# Patient Record
Sex: Female | Born: 1960 | Race: Black or African American | Hispanic: No | Marital: Single | Smoking: Never smoker
Health system: Southern US, Community
[De-identification: ages and names within clinical notes are randomized; demographics above are authoritative.]

## PROBLEM LIST (undated history)

## (undated) DIAGNOSIS — E119 Type 2 diabetes mellitus without complications: Secondary | ICD-10-CM

## (undated) DIAGNOSIS — I10 Essential (primary) hypertension: Secondary | ICD-10-CM

## (undated) HISTORY — PX: CHOLECYSTECTOMY: SHX55

## (undated) HISTORY — PX: ANKLE FRACTURE SURGERY: SHX122

## (undated) HISTORY — PX: ABDOMINAL HYSTERECTOMY: SHX81

## (undated) HISTORY — PX: THROAT SURGERY: SHX803

---

## 2006-08-27 DIAGNOSIS — F419 Anxiety disorder, unspecified: Secondary | ICD-10-CM | POA: Insufficient documentation

## 2006-09-12 DIAGNOSIS — M109 Gout, unspecified: Secondary | ICD-10-CM | POA: Insufficient documentation

## 2006-10-14 DIAGNOSIS — Z9089 Acquired absence of other organs: Secondary | ICD-10-CM | POA: Insufficient documentation

## 2006-10-14 DIAGNOSIS — M159 Polyosteoarthritis, unspecified: Secondary | ICD-10-CM | POA: Insufficient documentation

## 2006-10-14 DIAGNOSIS — Z9079 Acquired absence of other genital organ(s): Secondary | ICD-10-CM | POA: Insufficient documentation

## 2006-10-14 DIAGNOSIS — D369 Benign neoplasm, unspecified site: Secondary | ICD-10-CM | POA: Insufficient documentation

## 2010-09-05 DIAGNOSIS — E785 Hyperlipidemia, unspecified: Secondary | ICD-10-CM | POA: Insufficient documentation

## 2010-09-14 DIAGNOSIS — R0683 Snoring: Secondary | ICD-10-CM | POA: Insufficient documentation

## 2010-09-14 DIAGNOSIS — E669 Obesity, unspecified: Secondary | ICD-10-CM | POA: Insufficient documentation

## 2010-09-14 DIAGNOSIS — R0681 Apnea, not elsewhere classified: Secondary | ICD-10-CM | POA: Insufficient documentation

## 2016-03-26 ENCOUNTER — Emergency Department (HOSPITAL_COMMUNITY): Payer: Self-pay

## 2016-03-26 ENCOUNTER — Emergency Department (HOSPITAL_COMMUNITY)
Admission: EM | Admit: 2016-03-26 | Discharge: 2016-03-26 | Disposition: A | Discharge status: Home / Self Care | Payer: Self-pay | Attending: Emergency Medicine | Admitting: Emergency Medicine

## 2016-03-26 ENCOUNTER — Encounter (HOSPITAL_COMMUNITY): Payer: Self-pay

## 2016-03-26 DIAGNOSIS — E119 Type 2 diabetes mellitus without complications: Secondary | ICD-10-CM | POA: Insufficient documentation

## 2016-03-26 DIAGNOSIS — I10 Essential (primary) hypertension: Secondary | ICD-10-CM | POA: Insufficient documentation

## 2016-03-26 DIAGNOSIS — M79641 Pain in right hand: Secondary | ICD-10-CM | POA: Insufficient documentation

## 2016-03-26 DIAGNOSIS — M25511 Pain in right shoulder: Secondary | ICD-10-CM | POA: Insufficient documentation

## 2016-03-26 HISTORY — DX: Essential (primary) hypertension: I10

## 2016-03-26 HISTORY — DX: Type 2 diabetes mellitus without complications: E11.9

## 2016-03-26 MED ORDER — CYCLOBENZAPRINE HCL 10 MG PO TABS
5.0000 mg | ORAL_TABLET | Freq: Once | ORAL | Status: AC
  Administered 2016-03-26: 5 mg via ORAL
  Filled 2016-03-26: qty 1

## 2016-03-26 MED ORDER — ACETAMINOPHEN 325 MG PO TABS
650.0000 mg | ORAL_TABLET | Freq: Once | ORAL | Status: AC
  Administered 2016-03-26: 650 mg via ORAL
  Filled 2016-03-26: qty 2

## 2016-03-26 MED ORDER — CYCLOBENZAPRINE HCL 10 MG PO TABS: 10.0000 mg | ORAL_TABLET | Freq: Two times a day (BID) | ORAL | 0 refills | Status: DC | PRN

## 2016-03-26 NOTE — ED Provider Notes (Signed)
[facility]EM[facility]ENCY DEPT Provider Note   CSN: [id number] Arrival date & time: 03/26/16  1127     History   Chief Complaint Chief Complaint  Patient presents with  . Hand Pain    HPI Brenda Nixon is a 55 y.o. female who presents to the ED with right hand pain. Patient reports that over a month ago she had right shoulder pain and her PCP treated her for arthritis with medication. Patient reports that now the pain extends to her right hand and for past week has been bad enough to wake her up at night. At work she does repetitive motion with her hands. She has been taking tylenol arthritis without relief.  The history is provided by the patient. No language interpreter was used.    Past Medical History:  Diagnosis Date  . Diabetes mellitus without complication (HCC)   . Hypertension     There are no active problems to display for this patient.   Past Surgical History:  Procedure Laterality Date  . ABDOMINAL HYSTERECTOMY    . ANKLE FRACTURE SURGERY    . CHOLECYSTECTOMY    . THROAT SURGERY      OB History    No data available       Home Medications    Prior to Admission medications   Medication Sig Start Date End Date Taking? Authorizing Provider  cyclobenzaprine (FLEXERIL) 10 MG tablet Take 1 tablet (10 mg total) by mouth 2 (two) times daily as needed for muscle spasms. 03/26/16   Kenasia Scheller Orlene OchM Avaiyah Strubel, NP    Family History No family history on file.  Social History Social History  Substance Use Topics  . Smoking status: Never Smoker  . Smokeless tobacco: Never Used  . Alcohol use No     Allergies   Sulfa antibiotics   Review of Systems Review of Systems  Constitutional: Negative for fever.  HENT: Negative.   Eyes: Positive for itching. Negative for pain, redness and visual disturbance.  Respiratory: Negative for shortness of breath.   Cardiovascular: Negative for chest pain.  Gastrointestinal: Negative for abdominal distention and vomiting.  Musculoskeletal:  Positive for arthralgias.       Right shoulder and radiates down arm to hand.  Skin: Negative for rash and wound.  Neurological: Negative for dizziness and headaches.  Psychiatric/Behavioral: Negative for confusion. The patient is not nervous/anxious.      Physical Exam Updated Vital Signs BP 163/88 (BP Location: Left Arm)   Pulse 83   Temp 98.3 F (36.8 C) (Oral)   Resp 17   Ht 5\' 1"  (1.549 m)   Wt 70.8 kg   SpO2 100%   BMI 29.48 kg/m   Physical Exam  Constitutional: She is oriented to person, place, and time. She appears well-developed and well-nourished.  HENT:  Head: Normocephalic.  Eyes: EOM are normal.  Neck: Normal range of motion. Neck supple. Muscular tenderness present. No spinous process tenderness present.  Pulmonary/Chest: Effort normal.  Musculoskeletal:       Right shoulder: She exhibits tenderness, pain and spasm. She exhibits no crepitus, no deformity, no laceration, normal pulse and normal strength. Decreased range of motion: due to pain.  Pain to the anterior aspect of the right shoulder that radiates to the right hand. Radial pulses 2+, adequate circulation. Equal grips.   Neurological: She is alert and oriented to person, place, and time.  Skin: Skin is warm and dry.  Psychiatric: She has a normal mood and affect. Her behavior is normal.  Nursing  note and vitals reviewed.    ED Treatments / Results  Labs (all labs ordered are listed, but only abnormal results are displayed) Labs Reviewed - No data to display  Radiology Dg Shoulder Right  Result Date: 03/26/2016 CLINICAL DATA:  Right shoulder pain, chronic with recent worsening. No reported injury. EXAM: RIGHT SHOULDER - 2+ VIEW COMPARISON:  None. FINDINGS: No fracture, dislocation or suspicious focal osseous lesion. Moderate erosive arthropathy is seen at the right acromioclavicular joint. Right glenohumeral joint appears intact. No pathologic soft tissue calcifications. IMPRESSION: Moderate erosive  arthropathy at the right acromioclavicular joint, which could be due to rheumatoid arthritis, primary hyperparathyroidism or repetitive stress such as from weightlifting. Electronically Signed   By: Delbert Phenix M.D.   On: 03/26/2016 13:25   Dg Hand Complete Right  Result Date: 03/26/2016 CLINICAL DATA:  Pain extending from the shoulder to the anterior third metacarpal. EXAM: RIGHT HAND - COMPLETE 3+ VIEW COMPARISON:  None. FINDINGS: There is no evidence of fracture or dislocation. There is no evidence of arthropathy or other focal bone abnormality. Soft tissues are unremarkable. IMPRESSION: Negative. Electronically Signed   By: Gaylyn Rong M.D.   On: 03/26/2016 12:19    Procedures Procedures (including critical care time)  Medications Ordered in ED Medications  cyclobenzaprine (FLEXERIL) tablet 5 mg (5 mg Oral Given 03/26/16 1347)  acetaminophen (TYLENOL) tablet 650 mg (650 mg Oral Given 03/26/16 1347)     Initial Impression / Assessment and Plan / ED Course  I have reviewed the triage vital signs and the nursing notes.  Pertinent imaging results that were available during my care of the patient were reviewed by me and considered in my medical decision making (see chart for details).  Clinical Course    Final Clinical Impressions(s) / ED Diagnoses  55 y.o. female with right shoulder pain that radiates to the right arm and to the hand stable for d/c with improved symptoms after tylenol and flexeril, no focal neuro deficits. Discussed with the patient and all questioned fully answered. She will return  if any problems arise.   Final diagnoses:  Shoulder pain, acute, right  Hand pain, right    New Prescriptions Discharge Medication List as of 03/26/2016  2:30 PM    START taking these medications   Details  cyclobenzaprine (FLEXERIL) 10 MG tablet Take 1 tablet (10 mg total) by mouth 2 (two) times daily as needed for muscle spasms., Starting Mon 03/26/2016, Print           [place], NP 03/27/16 2254    Donnetta Hutching, MD 03/28/16 [phone]

## 2016-03-26 NOTE — ED Notes (Signed)
Patient currently in xray ?

## 2016-03-26 NOTE — ED Triage Notes (Signed)
Per pt, Pt is coming from home with right hand pain. Pt has a Hx of right arm pain for four months that has been being treated by PCP with medication. About a month ago, pt reports hand starting hurt in the center of her hand with no relief with TYlenol.

## 2017-04-01 ENCOUNTER — Encounter: Payer: Self-pay | Admitting: Endocrinology

## 2017-04-22 LAB — HM DIABETES EYE EXAM

## 2017-04-30 ENCOUNTER — Ambulatory Visit (INDEPENDENT_AMBULATORY_CARE_PROVIDER_SITE_OTHER): Payer: 59 | Admitting: Endocrinology

## 2017-04-30 ENCOUNTER — Ambulatory Visit: Payer: Self-pay | Admitting: Endocrinology

## 2017-04-30 ENCOUNTER — Encounter: Payer: Self-pay | Admitting: Endocrinology

## 2017-04-30 DIAGNOSIS — Z794 Long term (current) use of insulin: Secondary | ICD-10-CM

## 2017-04-30 DIAGNOSIS — E049 Nontoxic goiter, unspecified: Secondary | ICD-10-CM

## 2017-04-30 DIAGNOSIS — E119 Type 2 diabetes mellitus without complications: Secondary | ICD-10-CM | POA: Diagnosis not present

## 2017-04-30 DIAGNOSIS — I10 Essential (primary) hypertension: Secondary | ICD-10-CM | POA: Diagnosis not present

## 2017-04-30 MED ORDER — METFORMIN HCL ER 500 MG PO TB24: 2000.0000 mg | ORAL_TABLET | Freq: Every day | ORAL | 3 refills | Status: DC

## 2017-04-30 MED ORDER — DULAGLUTIDE 0.75 MG/0.5ML ~~LOC~~ SOAJ: 0.7500 mg | SUBCUTANEOUS | 11 refills | Status: DC

## 2017-04-30 MED ORDER — INSULIN DEGLUDEC 100 UNIT/ML ~~LOC~~ SOPN: 10.0000 [IU] | PEN_INJECTOR | Freq: Every day | SUBCUTANEOUS | 11 refills | Status: DC

## 2017-04-30 NOTE — Progress Notes (Signed)
Subjective:    Patient ID: Brenda Nixon, female    DOB: 1960-12-06, 56 y.o.   MRN: [id number]  HPI pt is referred by Salley Slaughter, NP, for diabetes.  Pt states DM was dx'ed in 1994 (she had GDM in 1990); she has mild if any neuropathy of the lower extremities; she is unaware of any associated chronic complications; she has been on insulin since 2015; pt says her diet and exercise are fair; she has never had GDM, pancreatitis, pancreatic surgery, severe hypoglycemia or DKA.  Pt says she was told at age 11, she had a goiter.  She takes tresiba, 10-40 units qhs, according to sliding scale, and 2 oral meds.  She says cbg's vary from 90-200's.  It is in general higher as the day goes on.  Past Medical History:  Diagnosis Date  . Diabetes mellitus without complication (HCC)   . Hypertension     Past Surgical History:  Procedure Laterality Date  . ABDOMINAL HYSTERECTOMY    . ANKLE FRACTURE SURGERY    . CHOLECYSTECTOMY    . THROAT SURGERY      Social History   Social History  . Marital status: Single    Spouse name: N/A  . Number of children: N/A  . Years of education: N/A   Occupational History  . Not on file.   Social History Main Topics  . Smoking status: Never Smoker  . Smokeless tobacco: Never Used  . Alcohol use No  . Drug use: No  . Sexual activity: Not on file   Other Topics Concern  . Not on file   Social History Narrative  . No narrative on file    Current Outpatient Prescriptions on File Prior to Visit  Medication Sig Dispense Refill  . cyclobenzaprine (FLEXERIL) 10 MG tablet Take 1 tablet (10 mg total) by mouth 2 (two) times daily as needed for muscle spasms. 20 tablet 0   No current facility-administered medications on file prior to visit.     Allergies  Allergen Reactions  . Sulfa Antibiotics     Family History  Problem Relation Age of Onset  . Diabetes Mother   . Diabetes Brother     BP (!) 130/98   Pulse 98   Ht  (1.549 m)   Wt 151 lb  (68.5 kg)   SpO2 97%   BMI 28.53 kg/m    Review of Systems denies blurry vision, headache, chest pain, sob, urinary frequency, muscle cramps, excessive diaphoresis, depression, cold intolerance, rhinorrhea, and easy bruising.  She has fatigue and weight gain.  She has nausea but no vomiting.       Objective:   Physical Exam VS: see vs page GEN: no distress HEAD: head: no deformity eyes: no periorbital swelling, no proptosis external nose and ears are normal mouth: no lesion seen NECK: thyroid is approx 3 times normal size.  No palpable nodule CHEST WALL: no deformity LUNGS: clear to auscultation CV: reg rate and rhythm, no murmur ABD: abdomen is soft, nontender.  no hepatosplenomegaly.  not distended.  no hernia MUSCULOSKELETAL: muscle bulk and strength are grossly normal.  no obvious joint swelling.  gait is normal and steady.   EXTEMITIES: no deformity.  no ulcer on the feet.  feet are of normal color and temp.  no edema PULSES: dorsalis pedis intact bilat.  no carotid bruit NEURO:  cn 2-12 grossly intact.   readily moves all 4's.  sensation is intact to touch on the feet SKIN:  Normal texture and temperature.  No rash or suspicious lesion is visible.   NODES:  None palpable at the neck PSYCH: alert, well-oriented.  Does not appear anxious nor depressed.    A1c=8.1%  I have reviewed outside records, and summarized: Pt was noted to have elevated a1c, and referred here.  She described increased appetite.  Main symptom addressed was dysuria     Assessment & Plan:  Insulin-requiring type 2 DM: she needs increased rx.   She may be manageable off insulin.  Goiter, new, uncertain etiology.    Patient Instructions  Let's check the ultrasound.  you will receive a phone call, about a day and time for an appointment. good diet and exercise significantly improve the control of your diabetes.  please let me know if you wish to be referred to a dietician.  high blood sugar is very  risky to your health.  you should see an eye doctor and dentist every year.  It is very important to get all recommended vaccinations.  Controlling your blood pressure and cholesterol drastically reduces the damage diabetes does to your body.  Those who smoke should quit.  Please discuss these with your doctor.  check your blood sugar twice a day.  vary the time of day when you check, between before the 3 meals, and at bedtime.  also check if you have symptoms of your blood sugar being too high or too low.  please keep a record of the readings and bring it to your next appointment here (or you can bring the meter itself).  You can write it on any piece of paper.  please call us sooner if your blood sugar goes below 70, or if you have a lot of readings over 200. I have sent 2 prescriptions to your pharmacy: to change metformin to extended-release, and to add "trulicity."  This is a low dose of trulicity, so please call or message [place] next week, to tell us how the blood sugar and nausea are doing, and how much insulin you are requiring. Our goal will be for you to get off the insulin, even if we have to add another non-insulin medication.   Please come back for a follow-up appointment in 6 weeks.

## 2017-04-30 NOTE — Patient Instructions (Addendum)
Let's check the ultrasound.  you will receive a phone call, about a day and time for an appointment. good diet and exercise significantly improve the control of your diabetes.  please let me know if you wish to be referred to a dietician.  high blood sugar is very risky to your health.  you should see an eye doctor and dentist every year.  It is very important to get all recommended vaccinations.  Controlling your blood pressure and cholesterol drastically reduces the damage diabetes does to your body.  Those who smoke should quit.  Please discuss these with your doctor.  check your blood sugar twice a day.  vary the time of day when you check, between before the 3 meals, and at bedtime.  also check if you have symptoms of your blood sugar being too high or too low.  please keep a record of the readings and bring it to your next appointment here (or you can bring the meter itself).  You can write it on any piece of paper.  please call us sooner if your blood sugar goes below 70, or if you have a lot of readings over 200. I have sent 2 prescriptions to your pharmacy: to change metformin to extended-release, and to add "trulicity."  This is a low dose of trulicity, so please call or message [place] next week, to tell us how the blood sugar and nausea are doing, and how much insulin you are requiring. Our goal will be for you to get off the insulin, even if we have to add another non-insulin medication.   Please come back for a follow-up appointment in 6 weeks.

## 2017-05-01 ENCOUNTER — Telehealth: Payer: Self-pay | Admitting: Endocrinology

## 2017-05-01 NOTE — Telephone Encounter (Addendum)
DAUGHTER CAME IN TO PICK UP NOTE FOR MOM RE: WORK NOTE. PATIENT WAS SEEN 04/30/17. NEEDS NOTE FOR WORK

## 2017-05-01 NOTE — Telephone Encounter (Signed)
Note printed, will bring up front.

## 2017-05-01 NOTE — Telephone Encounter (Signed)
Note placed up front

## 2017-05-01 NOTE — Telephone Encounter (Signed)
Patient's daughter came to pick up patient's note for work on her lunch break. Patient needs note for work (was seen yesterday) Please call patient to let them know when note is ready.

## 2017-05-02 DIAGNOSIS — E119 Type 2 diabetes mellitus without complications: Secondary | ICD-10-CM | POA: Insufficient documentation

## 2017-05-02 DIAGNOSIS — I10 Essential (primary) hypertension: Secondary | ICD-10-CM | POA: Insufficient documentation

## 2017-05-02 NOTE — Telephone Encounter (Signed)
I need to know what alternative is 

## 2017-05-02 NOTE — Telephone Encounter (Signed)
Patient is calling back on the status of medication trulicity, need something she can afford

## 2017-05-02 NOTE — Telephone Encounter (Signed)
Patient stated insurance will not cover trulicity.please advise

## 2017-05-03 NOTE — Telephone Encounter (Signed)
Pt is aware that we need to know the alternate and will call us back with it

## 2017-05-09 NOTE — Telephone Encounter (Signed)
Tried to call patient to inform her that he was pleased with blood sugars & to stay on same medication, but recieved a busy signal.

## 2017-05-09 NOTE — Telephone Encounter (Signed)
Ok, Please continue the same medications. I'll see you next time.   

## 2017-05-09 NOTE — Telephone Encounter (Signed)
Pt is only taking the metformin at this time and the farxiga due to not being able to afford the insulin due to not meeting her deductible with her insurance  BS are still ok running low within 135-140

## 2017-05-30 ENCOUNTER — Ambulatory Visit
Admission: RE | Admit: 2017-05-30 | Discharge: 2017-05-30 | Disposition: A | Discharge status: Home / Self Care | Payer: 59 | Source: Ambulatory Visit | Attending: Endocrinology | Admitting: Endocrinology

## 2017-05-30 ENCOUNTER — Telehealth: Payer: Self-pay | Admitting: Endocrinology

## 2017-05-30 DIAGNOSIS — E049 Nontoxic goiter, unspecified: Secondary | ICD-10-CM

## 2017-05-30 NOTE — Telephone Encounter (Signed)
Done

## 2017-05-30 NOTE — Telephone Encounter (Signed)
Patient want to know where dr Everardo Allellison she is getting her ultrasound. Because no one has called her

## 2017-06-11 ENCOUNTER — Encounter: Payer: Self-pay | Admitting: Endocrinology

## 2017-06-11 ENCOUNTER — Ambulatory Visit (INDEPENDENT_AMBULATORY_CARE_PROVIDER_SITE_OTHER): Payer: 59 | Admitting: Endocrinology

## 2017-06-11 VITALS — BP 128/80 | HR 84 | Wt 151.6 lb

## 2017-06-11 DIAGNOSIS — E119 Type 2 diabetes mellitus without complications: Secondary | ICD-10-CM | POA: Diagnosis not present

## 2017-06-11 DIAGNOSIS — Z794 Long term (current) use of insulin: Secondary | ICD-10-CM

## 2017-06-11 MED ORDER — REPAGLINIDE 1 MG PO TABS: 1.0000 mg | ORAL_TABLET | Freq: Three times a day (TID) | ORAL | 11 refills | Status: DC

## 2017-06-11 MED ORDER — METFORMIN HCL ER 500 MG PO TB24: 1000.0000 mg | ORAL_TABLET | Freq: Every day | ORAL | 3 refills | Status: DC

## 2017-06-11 NOTE — Patient Instructions (Addendum)
check your blood sugar twice a day.  vary the time of day when you check, between before the 3 meals, and at bedtime.  also check if you have symptoms of your blood sugar being too high or too low.  please keep a record of the readings and bring it to your next appointment here (or you can bring the meter itself).  You can write it on any piece of paper.  please call us sooner if your blood sugar goes below 70, or if you have a lot of readings over 200. For now, please: Reduce the metformin to 2 pills per day, and:  Add "repaglinide."  I have sent a prescription to your pharmacy.   Please come back for a follow-up appointment in 2 months.   We'll plan to recheck the thyroid ultrasound in 1 year.

## 2017-06-11 NOTE — Progress Notes (Signed)
Subjective:    Patient ID: Brenda Nixon, female    DOB: Dec 30, 1960, 56 y.o.   MRN: [id number]  HPI  Pt returns for f/u of diabetes mellitus: DM type: 2 Dx'ed: 1994 Complications: none Therapy: insulin since 2015 GDM: 1990 DKA: never Severe hypoglycemia: never Pancreatitis: never Pancreatic imaging: never Other: she took insulin 2016-2018 Interval history: She takes metformin only, due to cost.  She has nausea and diarrhea.  She says cbg's vary from 100-300's.     Past Medical History:  Diagnosis Date  . Diabetes mellitus without complication (HCC)   . Hypertension     Past Surgical History:  Procedure Laterality Date  . ABDOMINAL HYSTERECTOMY    . ANKLE FRACTURE SURGERY    . CHOLECYSTECTOMY    . THROAT SURGERY      Social History   Socioeconomic History  . Marital status: Single    Spouse name: Not on file  . Number of children: Not on file  . Years of education: Not on file  . Highest education level: Not on file  Social Needs  . Financial resource strain: Not on file  . Food insecurity - worry: Not on file  . Food insecurity - inability: Not on file  . Transportation needs - medical: Not on file  . Transportation needs - non-medical: Not on file  Occupational History  . Not on file  Tobacco Use  . Smoking status: Never Smoker  . Smokeless tobacco: Never Used  Substance and Sexual Activity  . Alcohol use: No  . Drug use: No  . Sexual activity: Not on file  Other Topics Concern  . Not on file  Social History Narrative  . Not on file    Current Outpatient Medications on File Prior to Visit  Medication Sig Dispense Refill  . chlorthalidone (HYGROTON) 25 MG tablet Take 25 mg by mouth daily.    . cyclobenzaprine (FLEXERIL) 10 MG tablet Take 1 tablet (10 mg total) by mouth 2 (two) times daily as needed for muscle spasms. 20 tablet 0  . ibuprofen (ADVIL,MOTRIN) 800 MG tablet Take 800 mg by mouth every 8 (eight) hours as needed.     No current  facility-administered medications on file prior to visit.     Allergies  Allergen Reactions  . Sulfa Antibiotics     Family History  Problem Relation Age of Onset  . Diabetes Mother   . Diabetes Brother     BP 128/80 (BP Location: Right Arm, Patient Position: Sitting, Cuff Size: Normal)   Pulse 84   Wt 151 lb 9.6 oz (68.8 kg)   SpO2 97%   BMI 28.64 kg/m   Review of Systems She denies hypoglycemia    Objective:   Physical Exam VITAL SIGNS:  See vs page GENERAL: no distress Pulses: foot pulses are intact bilaterally.   MSK: no deformity of the feet or ankles.  CV: no edema of the legs or ankles Skin:  no ulcer on the feet or ankles.  normal color and temp on the feet and ankles Neuro: sensation is intact to touch on the feet and ankles.      A1c=7.9%    Assessment & Plan:  Type 2 DM: he needs increased rx Nausea: new   Patient Instructions  check your blood sugar twice a day.  vary the time of day when you check, between before the 3 meals, and at bedtime.  also check if you have symptoms of your blood sugar being too high or  too low.  please keep a record of the readings and bring it to your next appointment here (or you can bring the meter itself).  You can write it on any piece of paper.  please call us sooner if your blood sugar goes below 70, or if you have a lot of readings over 200. For now, please: Reduce the metformin to 2 pills per day, and:  Add "repaglinide."  I have sent a prescription to your pharmacy.   Please come back for a follow-up appointment in 2 months.   We'll plan to recheck the thyroid ultrasound in 1 year.

## 2017-06-26 ENCOUNTER — Telehealth: Payer: Self-pay | Admitting: Endocrinology

## 2017-06-26 NOTE — Telephone Encounter (Signed)
Pt having diarrhea for one week pt concerned it may be from Repaglinide and / or Metformin. Please advise.

## 2017-06-26 NOTE — Telephone Encounter (Signed)
Try stopping metformin x a few days, to see if this helps

## 2017-06-26 NOTE — Telephone Encounter (Signed)
I called patient & received patient's VM. I asked her to stop taking metformin for a couple days then call to let us know if stomach discomfort ceases.

## 2017-07-18 ENCOUNTER — Telehealth: Payer: Self-pay

## 2017-07-18 NOTE — Telephone Encounter (Signed)
Thi does not meet patient safety standards.  However, you could come in for a visit that we could make as brief as possible.  This is the best we can do.

## 2017-07-18 NOTE — Telephone Encounter (Signed)
Patient has lost her job and is unable to come in to see you on the 28th but she stated her bs is running very high and is wondering if you can call in something- at this time she is only taking reglan please advise

## 2017-07-22 NOTE — Telephone Encounter (Signed)
I called patient & informed her that if you don't have insurance you do get a discount on OV. I was not sure of the amount however. Patient stated that she would call back when she received her check because she was unsure if she could afford visit.

## 2017-08-09 ENCOUNTER — Other Ambulatory Visit: Payer: Self-pay

## 2017-08-09 ENCOUNTER — Telehealth: Payer: Self-pay | Admitting: Endocrinology

## 2017-08-09 NOTE — Telephone Encounter (Signed)
-----   Message from Stevan BornPatricia Nixon sent at 08/09/2017 11:08 AM EST ----- Regarding: [id number] This patient stated that she is out of her medication, Pharmacy told her that she couldn't get a refill unless she make and apppointment to see you first. She is asking will you see her today, if so she can come around 3:00  Please Advise

## 2017-08-09 NOTE — Telephone Encounter (Signed)
OK 

## 2017-08-09 NOTE — Telephone Encounter (Signed)
I called patient back & she was upset. I apologized that this was the first time I was seeing the message or made aware of the situation. She stated that repaglinide isn't keeping her blood sugars down & she can't just take off work to come in. I asked if she wanted me to schedule f/u first available then ask if I could refill med 1x. She stated no that she would call back if she decided to come back.

## 2017-08-12 ENCOUNTER — Ambulatory Visit: Payer: 59 | Admitting: Endocrinology

## 2017-08-15 ENCOUNTER — Telehealth: Payer: Self-pay | Admitting: Endocrinology

## 2017-08-15 ENCOUNTER — Encounter: Payer: Self-pay | Admitting: Endocrinology

## 2017-08-15 ENCOUNTER — Ambulatory Visit (INDEPENDENT_AMBULATORY_CARE_PROVIDER_SITE_OTHER): Payer: 59 | Admitting: Endocrinology

## 2017-08-15 ENCOUNTER — Other Ambulatory Visit: Payer: Self-pay

## 2017-08-15 VITALS — BP 132/78 | HR 83 | Wt 159.0 lb

## 2017-08-15 DIAGNOSIS — Z794 Long term (current) use of insulin: Secondary | ICD-10-CM

## 2017-08-15 DIAGNOSIS — E119 Type 2 diabetes mellitus without complications: Secondary | ICD-10-CM

## 2017-08-15 LAB — POCT GLYCOSYLATED HEMOGLOBIN (HGB A1C): Hemoglobin A1C: 9.6

## 2017-08-15 MED ORDER — INSULIN GLARGINE 100 UNIT/ML SOLOSTAR PEN: 40.0000 [IU] | PEN_INJECTOR | SUBCUTANEOUS | 99 refills | Status: DC

## 2017-08-15 MED ORDER — INSULIN NPH (HUMAN) (ISOPHANE) 100 UNIT/ML ~~LOC~~ SUSP: 40.0000 [IU] | SUBCUTANEOUS | 11 refills | Status: AC

## 2017-08-15 MED ORDER — BASAGLAR KWIKPEN 100 UNIT/ML ~~LOC~~ SOPN: 40.0000 [IU] | PEN_INJECTOR | SUBCUTANEOUS | 11 refills | Status: DC

## 2017-08-15 NOTE — Telephone Encounter (Signed)
Patient called back & she said that she would see if novolin N was still too expensive & if it was she would buy the [facility] Relion N instead.

## 2017-08-15 NOTE — Telephone Encounter (Signed)
Ok, I have sent a prescription to your pharmacy, to change to basaglar.  Please let us know if this is expensive, too.  If so, that is probably due to a high deductible, and then we'll change to N[facility] from [facility].

## 2017-08-15 NOTE — Telephone Encounter (Signed)
Patient called back & due to not having new insurance card prescription is still too high. I advised her to ahead & buy a bottle of the Relion N. I told her once you told me how many units I would call her back with that information. Please advise?

## 2017-08-15 NOTE — Telephone Encounter (Signed)
I called patient & stated basaglar was went to pharmacy. I asked her to call back if cost was still to high.

## 2017-08-15 NOTE — Progress Notes (Signed)
Subjective:    Patient ID: Brenda Nixon, female    DOB: May 31, 1961, 57 y.o.   MRN: [id number]  HPI Pt returns for f/u of diabetes mellitus: DM type: 2 Dx'ed: 1994 Complications: none Therapy: 2 oral meds.  GDM: 1990 DKA: never Severe hypoglycemia: never.  Pancreatitis: never Pancreatic imaging: never Other: she took insulin 2016-2018.  Interval history: She stopped metformin, due to nausea and diarrhea.  She says cbg's are in the 300's.  Past Medical History:  Diagnosis Date  . Diabetes mellitus without complication (HCC)   . Hypertension     Past Surgical History:  Procedure Laterality Date  . ABDOMINAL HYSTERECTOMY    . ANKLE FRACTURE SURGERY    . CHOLECYSTECTOMY    . THROAT SURGERY      Social History   Socioeconomic History  . Marital status: Single    Spouse name: Not on file  . Number of children: Not on file  . Years of education: Not on file  . Highest education level: Not on file  Social Needs  . Financial resource strain: Not on file  . Food insecurity - worry: Not on file  . Food insecurity - inability: Not on file  . Transportation needs - medical: Not on file  . Transportation needs - non-medical: Not on file  Occupational History  . Not on file  Tobacco Use  . Smoking status: Never Smoker  . Smokeless tobacco: Never Used  Substance and Sexual Activity  . Alcohol use: No  . Drug use: No  . Sexual activity: Not on file  Other Topics Concern  . Not on file  Social History Narrative  . Not on file    Current Outpatient Medications on File Prior to Visit  Medication Sig Dispense Refill  . chlorthalidone (HYGROTON) 25 MG tablet Take 25 mg by mouth daily.    . cyclobenzaprine (FLEXERIL) 10 MG tablet Take 1 tablet (10 mg total) by mouth 2 (two) times daily as needed for muscle spasms. 20 tablet 0  . ibuprofen (ADVIL,MOTRIN) 800 MG tablet Take 800 mg by mouth every 8 (eight) hours as needed.     No current facility-administered medications on  file prior to visit.     Allergies  Allergen Reactions  . Sulfa Antibiotics     Family History  Problem Relation Age of Onset  . Diabetes Mother   . Diabetes Brother     BP 132/78 (BP Location: Left Arm, Patient Position: Sitting, Cuff Size: Normal)   Pulse 83   Wt 159 lb (72.1 kg)   SpO2 99%   BMI 30.04 kg/m    Review of Systems Denies hypoglycemia    Objective:   Physical Exam VITAL SIGNS:  See vs page GENERAL: no distress Pulses: dorsalis pedis intact bilat.   MSK: no deformity of the feet CV: no leg edema Skin:  no ulcer on the feet.  normal color and temp on the feet. Neuro: sensation is intact to touch on the feet  A1c=9.6%     Assessment & Plan:  Type 2 DM: worse.  She has failed oral rx Noncompliance with cbg recording: in this setting, I favor QD insulin dosing.    Patient Instructions  check your blood sugar twice a day.  vary the time of day when you check, between before the 3 meals, and at bedtime.  also check if you have symptoms of your blood sugar being too high or too low.  please keep a record of the readings  and bring it to your next appointment here (or you can bring the meter itself).  You can write it on any piece of paper.  please call us sooner if your blood sugar goes below 70, or if you have a lot of readings over 200. Please change the diabetes pills to lantus, 40 units each morning. Please call or message us next week, to tell us how the blood sugar is doing.   Please come back for a follow-up appointment in 2 months.   We'll plan to recheck the thyroid ultrasound in later this year.        Diabetes Mellitus and Nutrition When you have diabetes (diabetes mellitus), it is very important to have healthy eating habits because your blood sugar (glucose) levels are greatly affected by what you eat and drink. Eating healthy foods in the appropriate amounts, at about the same times every day, can help you:  Control your blood  glucose.  Lower your risk of heart disease.  Improve your blood pressure.  Reach or maintain a healthy weight.  Every person with diabetes is different, and each person has different needs for a meal plan. Your health care provider may recommend that you work with a diet and nutrition specialist (dietitian) to make a meal plan that is best for you. Your meal plan may vary depending on factors such as:  The calories you need.  The medicines you take.  Your weight.  Your blood glucose, blood pressure, and cholesterol levels.  Your activity level.  Other health conditions you have, such as heart or kidney disease.  How do carbohydrates affect me? Carbohydrates affect your blood glucose level more than any other type of food. Eating carbohydrates naturally increases the amount of glucose in your blood. Carbohydrate counting is a method for keeping track of how many carbohydrates you eat. Counting carbohydrates is important to keep your blood glucose at a healthy level, especially if you use insulin or take certain oral diabetes medicines. It is important to know how many carbohydrates you can safely have in each meal. This is different for every person. Your dietitian can help you calculate how many carbohydrates you should have at each meal and for snack. Foods that contain carbohydrates include:  Bread, cereal, rice, pasta, and crackers.  Potatoes and corn.  Peas, beans, and lentils.  Milk and yogurt.  Fruit and juice.  Desserts, such as cakes, cookies, ice cream, and candy.  How does alcohol affect me? Alcohol can cause a sudden decrease in blood glucose (hypoglycemia), especially if you use insulin or take certain oral diabetes medicines. Hypoglycemia can be a life-threatening condition. Symptoms of hypoglycemia (sleepiness, dizziness, and confusion) are similar to symptoms of having too much alcohol. If your health care provider says that alcohol is safe for you, follow  these guidelines:  Limit alcohol intake to no more than 1 drink per day for nonpregnant women and 2 drinks per day for men. One drink equals 12 oz of beer, 5 oz of wine, or 1 oz of hard liquor.  Do not drink on an empty stomach.  Keep yourself hydrated with water, diet soda, or unsweetened iced tea.  Keep in mind that regular soda, juice, and other mixers may contain a lot of sugar and must be counted as carbohydrates.  What are tips for following this plan? Reading food labels  Start by checking the serving size on the label. The amount of calories, carbohydrates, fats, and other nutrients listed on the label  are based on one serving of the food. Many foods contain more than one serving per package.  Check the total grams (g) of carbohydrates in one serving. You can calculate the number of servings of carbohydrates in one serving by dividing the total carbohydrates by 15. For example, if a food has 30 g of total carbohydrates, it would be equal to 2 servings of carbohydrates.  Check the number of grams (g) of saturated and trans fats in one serving. Choose foods that have low or no amount of these fats.  Check the number of milligrams (mg) of sodium in one serving. Most people should limit total sodium intake to less than 2,300 mg per day.  Always check the nutrition information of foods labeled as "low-fat" or "nonfat". These foods may be higher in added sugar or refined carbohydrates and should be avoided.  Talk to your dietitian to identify your daily goals for nutrients listed on the label. Shopping  Avoid buying canned, premade, or processed foods. These foods tend to be high in fat, sodium, and added sugar.  Shop around the outside edge of the grocery store. This includes fresh fruits and vegetables, bulk grains, fresh meats, and fresh dairy. Cooking  Use low-heat cooking methods, such as baking, instead of high-heat cooking methods like deep frying.  Cook using healthy oils,  such as olive, canola, or sunflower oil.  Avoid cooking with butter, cream, or high-fat meats. Meal planning  Eat meals and snacks regularly, preferably at the same times every day. Avoid going long periods of time without eating.  Eat foods high in fiber, such as fresh fruits, vegetables, beans, and whole grains. Talk to your dietitian about how many servings of carbohydrates you can eat at each meal.  Eat 4-6 ounces of lean protein each day, such as lean meat, chicken, fish, eggs, or tofu. 1 ounce is equal to 1 ounce of meat, chicken, or fish, 1 egg, or 1/4 cup of tofu.  Eat some foods each day that contain healthy fats, such as avocado, nuts, seeds, and fish. Lifestyle   Check your blood glucose regularly.  Exercise at least 30 minutes 5 or more days each week, or as told by your health care provider.  Take medicines as told by your health care provider.  Do not use any products that contain nicotine or tobacco, such as cigarettes and e-cigarettes. If you need help quitting, ask your health care provider.  Work with a Veterinary surgeon or diabetes educator to identify strategies to manage stress and any emotional and social challenges. What are some questions to ask my health care provider?  Do I need to meet with a diabetes educator?  Do I need to meet with a dietitian?  What number can I call if I have questions?  When are the best times to check my blood glucose? Where to find more information:  American Diabetes Association: diabetes.org/food-and-fitness/food  Academy of Nutrition and Dietetics: https://www.vargas.com/  [employer] of Diabetes and Digestive and Kidney Diseases (NIH): FindJewelers.cz Summary  A healthy meal plan will help you control your blood glucose and maintain a healthy lifestyle.  Working with a diet and nutrition specialist  (dietitian) can help you make a meal plan that is best for you.  Keep in mind that carbohydrates and alcohol have immediate effects on your blood glucose levels. It is important to count carbohydrates and to use alcohol carefully. This information is not intended to replace advice given to you by your health care provider.  Make sure you discuss any questions you have with your health care provider. Document Released: 03/29/2005 Document Revised: 08/06/2016 Document Reviewed: 08/06/2016 Elsevier Interactive Patient Education  [employer].

## 2017-08-15 NOTE — Patient Instructions (Addendum)
check your blood sugar twice a day.  vary the time of day when you check, between before the 3 meals, and at bedtime.  also check if you have symptoms of your blood sugar being too high or too low.  please keep a record of the readings and bring it to your next appointment here (or you can bring the meter itself).  You can write it on any piece of paper.  please call us sooner if your blood sugar goes below 70, or if you have a lot of readings over 200. Please change the diabetes pills to lantus, 40 units each morning. Please call or message [place] next week, to tell us how the blood sugar is doing.   Please come back for a follow-up appointment in 2 months.   We'll plan to recheck the thyroid ultrasound in later this year.        Diabetes Mellitus and Nutrition When you have diabetes (diabetes mellitus), it is very important to have healthy eating habits because your blood sugar (glucose) levels are greatly affected by what you eat and drink. Eating healthy foods in the appropriate amounts, at about the same times every day, can help you:  Control your blood glucose.  Lower your risk of heart disease.  Improve your blood pressure.  Reach or maintain a healthy weight.  Every person with diabetes is different, and each person has different needs for a meal plan. Your health care provider may recommend that you work with a diet and nutrition specialist (dietitian) to make a meal plan that is best for you. Your meal plan may vary depending on factors such as:  The calories you need.  The medicines you take.  Your weight.  Your blood glucose, blood pressure, and cholesterol levels.  Your activity level.  Other health conditions you have, such as heart or kidney disease.  How do carbohydrates affect me? Carbohydrates affect your blood glucose level more than any other type of food. Eating carbohydrates naturally increases the amount of glucose in your blood. Carbohydrate counting is a  method for keeping track of how many carbohydrates you eat. Counting carbohydrates is important to keep your blood glucose at a healthy level, especially if you use insulin or take certain oral diabetes medicines. It is important to know how many carbohydrates you can safely have in each meal. This is different for every person. Your dietitian can help you calculate how many carbohydrates you should have at each meal and for snack. Foods that contain carbohydrates include:  Bread, cereal, rice, pasta, and crackers.  Potatoes and corn.  Peas, beans, and lentils.  Milk and yogurt.  Fruit and juice.  Desserts, such as cakes, cookies, ice cream, and candy.  How does alcohol affect me? Alcohol can cause a sudden decrease in blood glucose (hypoglycemia), especially if you use insulin or take certain oral diabetes medicines. Hypoglycemia can be a life-threatening condition. Symptoms of hypoglycemia (sleepiness, dizziness, and confusion) are similar to symptoms of having too much alcohol. If your health care provider says that alcohol is safe for you, follow these guidelines:  Limit alcohol intake to no more than 1 drink per day for nonpregnant women and 2 drinks per day for men. One drink equals 12 oz of beer, 5 oz of wine, or 1 oz of hard liquor.  Do not drink on an empty stomach.  Keep yourself hydrated with water, diet soda, or unsweetened iced tea.  Keep in mind that regular soda, juice, and  other mixers may contain a lot of sugar and must be counted as carbohydrates.  What are tips for following this plan? Reading food labels  Start by checking the serving size on the label. The amount of calories, carbohydrates, fats, and other nutrients listed on the label are based on one serving of the food. Many foods contain more than one serving per package.  Check the total grams (g) of carbohydrates in one serving. You can calculate the number of servings of carbohydrates in one serving by  dividing the total carbohydrates by 15. For example, if a food has 30 g of total carbohydrates, it would be equal to 2 servings of carbohydrates.  Check the number of grams (g) of saturated and trans fats in one serving. Choose foods that have low or no amount of these fats.  Check the number of milligrams (mg) of sodium in one serving. Most people should limit total sodium intake to less than 2,300 mg per day.  Always check the nutrition information of foods labeled as "low-fat" or "nonfat". These foods may be higher in added sugar or refined carbohydrates and should be avoided.  Talk to your dietitian to identify your daily goals for nutrients listed on the label. Shopping  Avoid buying canned, premade, or processed foods. These foods tend to be high in fat, sodium, and added sugar.  Shop around the outside edge of the grocery store. This includes fresh fruits and vegetables, bulk grains, fresh meats, and fresh dairy. Cooking  Use low-heat cooking methods, such as baking, instead of high-heat cooking methods like deep frying.  Cook using healthy oils, such as olive, canola, or sunflower oil.  Avoid cooking with butter, cream, or high-fat meats. Meal planning  Eat meals and snacks regularly, preferably at the same times every day. Avoid going long periods of time without eating.  Eat foods high in fiber, such as fresh fruits, vegetables, beans, and whole grains. Talk to your dietitian about how many servings of carbohydrates you can eat at each meal.  Eat 4-6 ounces of lean protein each day, such as lean meat, chicken, fish, eggs, or tofu. 1 ounce is equal to 1 ounce of meat, chicken, or fish, 1 egg, or 1/4 cup of tofu.  Eat some foods each day that contain healthy fats, such as avocado, nuts, seeds, and fish. Lifestyle   Check your blood glucose regularly.  Exercise at least 30 minutes 5 or more days each week, or as told by your health care provider.  Take medicines as told by  your health care provider.  Do not use any products that contain nicotine or tobacco, such as cigarettes and e-cigarettes. If you need help quitting, ask your health care provider.  Work with a Veterinary surgeoncounselor or diabetes educator to identify strategies to manage stress and any emotional and social challenges. What are some questions to ask my health care provider?  Do I need to meet with a diabetes educator?  Do I need to meet with a dietitian?  What number can I call if I have questions?  When are the best times to check my blood glucose? Where to find more information:  American Diabetes Association: diabetes.org/food-and-fitness/food  Academy of Nutrition and Dietetics: https://www.vargas.com/www.eatright.org/resources/health/diseases-and-conditions/diabetes  [employer] of Diabetes and Digestive and Kidney Diseases (NIH): FindJewelers.czwww.niddk.nih.gov/health-information/diabetes/overview/diet-eating-physical-activity Summary  A healthy meal plan will help you control your blood glucose and maintain a healthy lifestyle.  Working with a diet and nutrition specialist (dietitian) can help you make a meal plan  that is best for you.  Keep in mind that carbohydrates and alcohol have immediate effects on your blood glucose levels. It is important to count carbohydrates and to use alcohol carefully. This information is not intended to replace advice given to you by your health care provider. Make sure you discuss any questions you have with your health care provider. Document Released: 03/29/2005 Document Revised: 08/06/2016 Document Reviewed: 08/06/2016 Elsevier Interactive Patient Education  [employer].

## 2017-08-15 NOTE — Telephone Encounter (Signed)
Patient stated that the medication lantus that was sent to her pharmacy was $100.00 and she can't afford it is there another alternative.  Please advise

## 2017-08-15 NOTE — Telephone Encounter (Signed)
OK, I sent to [facility].  Same dosage

## 2017-09-09 ENCOUNTER — Encounter: Payer: Self-pay | Admitting: Endocrinology

## 2017-09-09 ENCOUNTER — Ambulatory Visit (INDEPENDENT_AMBULATORY_CARE_PROVIDER_SITE_OTHER): Payer: 59 | Admitting: Endocrinology

## 2017-09-09 VITALS — BP 148/92 | HR 85 | Resp 98 | Wt 155.0 lb

## 2017-09-09 DIAGNOSIS — Z794 Long term (current) use of insulin: Secondary | ICD-10-CM

## 2017-09-09 DIAGNOSIS — E042 Nontoxic multinodular goiter: Secondary | ICD-10-CM

## 2017-09-09 DIAGNOSIS — E1165 Type 2 diabetes mellitus with hyperglycemia: Secondary | ICD-10-CM

## 2017-09-09 MED ORDER — SEMAGLUTIDE(0.25 OR 0.5MG/DOS) 2 MG/1.5ML ~~LOC~~ SOPN: 0.5000 mg | PEN_INJECTOR | SUBCUTANEOUS | 2 refills | Status: DC

## 2017-09-09 NOTE — Patient Instructions (Addendum)
Check blood sugars on waking up 4-5/7  Also check blood sugars about 2 hours after a meal and do this after different meals by rotation  Recommended blood sugar levels on waking up is 90-130 and about 2 hours after meal is 130-160  Please bring your blood sugar monitor to each visit, thank you    Start taking Metformin 500 mg, 1 tablet with your main meal for 5 days.  Occasionally this may initially cause loose stools or nausea. If  tolerating well after 5 days add a second Metformin tablet (500 mg) at the same time.  Continue adding another tablet after 5 days days if no persistent nausea or diarrhea until reaching the 3/ day dose

## 2017-09-09 NOTE — Progress Notes (Signed)
Patient ID: Brenda Nixon, female   DOB: 1961/01/01, 57 y.o.   MRN: [id number]          Reason for Appointment: Repeat consultation for Type 2 Diabetes  Referring physician: None   History of Present Illness:          Date of diagnosis of type 2 diabetes mellitus: 1994        Background history:   She has been [place] for her diabetes out of state most of the time and by her PCP in the last year or so She had been on various oral medications in the past, mostly metformin Also had some point had tried Trulicity with some success She thinks she started insulin in 2008 Has been on various insulin regimens including Lantus, Tresiba and NPH and possibly NovoLog Although previous records are not available she thinks her A1c has been close to 7 at some point  Recent history:   INSULIN regimen is:  NPH 40 units daily       Non-insulin hypoglycemic drugs the patient is taking are: None  Current management, blood sugar patterns and problems identified:  She had been taking Lantus and metformin last month when her A1c was 9.6  At that time she thinks her blood sugars were running about 120 in the morning but about 180 around suppertime  She checks her blood sugar sporadically because of not being able to afford the test strips for her contour meter  .  She has been referred to the dietitian for meal planning but has not been scheduled yet  She is sometimes eating unbalanced meals such as oatmeal and fruit  Appeared to have gained weight since last year  This month because of not being able to afford her Lantus she has gone to NPH from [place] and she thinks her blood sugars are higher, 150 in the morning and 200 before suppertime  Also because of diarrhea with 2000 mg of metformin ER she has been told to stop this completely, however she probably had taken smaller doses without problems in the past  She does not check her sugars after meals  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    Self-care: The diet that the patient has been following is: tries to limit carbs, fried food.       Typical meal intake: Breakfast is steel cut oats, fruit               Dietician visit, most recent:none               Exercise: walks most of the day with work routine   Weight history:  Wt Readings from Last 3 Encounters:  09/09/17 155 lb (70.3 kg)  08/15/17 159 lb (72.1 kg)  06/11/17 151 lb 9.6 oz (68.8 kg)    Glycemic control:   Lab Results  Component Value Date   HGBA1C 9.6 08/15/2017   No results found for: GLUF, MICROALBUR, LDLCALC, CREATININE No results found for: MICRALBCREAT  No results found for: FRUCTOSAMINE    Allergies as of 09/09/2017      Reactions   Sulfa Antibiotics  Medication List        Accurate as of 09/09/17  4:57 PM. Always use your most recent med list.          chlorthalidone 25 MG tablet Commonly known as:  HYGROTON Take 25 mg by mouth daily.   cyclobenzaprine 10 MG tablet Commonly known as:  FLEXERIL Take 1 tablet (10 mg total) by mouth 2 (two) times daily as needed for muscle spasms.   ibuprofen 800 MG tablet Commonly known as:  ADVIL,MOTRIN Take 800 mg by mouth every 8 (eight) hours as needed.   insulin NPH Human 100 UNIT/ML injection Commonly known as:  NOVOLIN N Inject 0.4 mLs (40 Units total) into the skin every morning.   Semaglutide 0.25 or 0.5 MG/DOSE Sopn Commonly known as:  OZEMPIC Inject 0.5 mg into the skin once a week.       Allergies:  Allergies  Allergen Reactions  . Sulfa Antibiotics     Past Medical History:  Diagnosis Date  . Diabetes mellitus without complication (HCC)   . Hypertension     Past Surgical History:  Procedure  Laterality Date  . ABDOMINAL HYSTERECTOMY    . ANKLE FRACTURE SURGERY    . CHOLECYSTECTOMY    . THROAT SURGERY      Family History  Problem Relation Age of Onset  . Diabetes Mother   . Diabetes Brother     Social History:  reports that  has never smoked. she has never used smokeless tobacco. She reports that she does not drink alcohol or use drugs.   Review of Systems  HENT: Negative for trouble swallowing.   Cardiovascular: Negative for leg swelling.  Endocrine: Positive for fatigue and cold intolerance.  Musculoskeletal: Negative for joint pain.  Neurological: Positive for numbness. Negative for tingling.       Only the tip of the left third toe.  No pains in her legs and feet, has leg numbness on the left side of leg also at times  Psychiatric/Behavioral: Negative for insomnia.   THYROID:  Patient is concerned that she had an abnormal thyroid exam and she was sent for an ultrasound This showed multinodular goiter with small nodules not requiring biopsy No records available for her thyroid function tests for about a year She thinks that she feels more tired than usual and also sensitive to cold  Lipid history: Her last cholesterol was 200 but no LDL available, has not been on any medications   No results found for: CHOL, HDL, LDLCALC, LDLDIRECT, TRIG, CHOLHDL         Hypertension: This has been managed by her PCP with chlorthalidone alone  BP Readings from Last 3 Encounters:  09/09/17 (!) 148/92  08/15/17 132/78  06/11/17 128/80    Most recent eye exam was in 2018, reportedly no retinopathy  Most recent foot exam: 2/19    LABS:  No visits with results within 1 Week(s) from this visit.  Latest known visit with results is:  Office Visit on 08/15/2017  Component Date Value Ref Range Status  . Hemoglobin A1C 08/15/2017 9.6   Final    Physical Examination:  BP (!) 148/92 (BP Location: Left Arm, Patient Position: Sitting, Cuff Size: Normal)   Pulse 85   Resp  (!) 98   Wt 155 lb (70.3 kg)   BMI 29.29 kg/m    HEENT:         Eye exam shows normal external appearance.  Fundus exam shows no retinopathy.   Oral exam  shows normal mucosa .  NECK:   There is no lymphadenopathy   Thyroid is bilaterally enlarged, firm, nodular with most nodules about 1-1.5 cm Carotids are normal to palpation and no bruit heard    NEUROLOGICAL:   Ankle jerks are absent bilaterally.    Diabetic Foot Exam - Simple   Simple Foot Form Diabetic Foot exam was performed with the following findings:  Yes   Visual Inspection No deformities, no ulcerations, no other skin breakdown bilaterally:  Yes Sensation Testing Intact to touch and monofilament testing bilaterally:  Yes Pulse Check Posterior Tibialis and Dorsalis pulse intact bilaterally:  Yes Comments            Vibration sense is just mildly reduced in distal first toes.    EXTREMITIES:     There is no edema.     ASSESSMENT:  Diabetes type 2, uncontrolled with last A1c 9.6  She likely has insulin deficiency with long-standing history of diabetes and also continued use of insulin for almost 10 years  Currently the patient has very little knowledge about diabetes, management, meal planning, actions of different types of insulin and role of insulin and blood sugar control She has inadequate knowledge of blood sugar patterns and when to check her blood sugars Blood sugars are poorly controlled with just taking NPH once a day    Complications of diabetes: None evident currently, has only minimal neuropathic symptoms  PLAN:     Patient had education regarding all of the above knowledge deficits with details about insulin action, balanced meals, long-term diet recommendations, blood sugar targets and timing of glucose monitoring, differentiation of basal and bolus insulin types and action of both along with use of GLP-1 drugs and their benefits and side effects  Currently the patient does have a supply of  Tresiba in metformin and since she cannot afford any other medications until at least next week she will start this  Discussed in detail the use of Tresiba as a basal insulin and reviewed with her the titration based on fasting blood sugar every 3 days.  She will increase the dose by 2 units until the fasting readings are consistently under 130, flow sheet provided for her to use   Discussed food efficacy with weight loss and blood sugar control of using Ozempic versus Trulicity and if she is able to get this covered would prefer this, written prescription given Start OZEMPIC using the pen as shown once weekly on the same day of the week. Discussed with the patient the nature of GLP-1 drugs, the action on various organ systems, how they benefit blood glucose control, as well as the benefit of weight loss and  increase satiety . Explained possible side effects, particularly nausea and vomiting that usually resolve over time; discussed safety information in package insert. Demonstrated the medication injection device and injection technique to the patient.  Showed patient where to inject the medication.  She will start taking METFORMIN ER that she has at home starting 1 tablet daily with her main meal and every 5 days increasing by 1 tablet until she can take 3 tablets or maximally tolerated dose  She will also check to see if her contour meter is covered by her new insurance or provide [place] with the name of the current brand  Otherwise she will use the [employer] brand meter and keep a record of her blood sugars  diet: She was advised to avoid fruit in the morning and have a protein like a  boiled egg  She will also keep a record of her meals and pre-and postprandial blood sugars to review with dietitian  Follow-up in 1 month  For her goiter and fatigue she will need thyroid functions test Also Labs will need to assess her microalbumin level Consultation with dietitian needs to be done, she will  schedule  Patient Instructions  Check blood sugars on waking up 4-5/7  Also check blood sugars about 2 hours after a meal and do this after different meals by rotation  Recommended blood sugar levels on waking up is 90-130 and about 2 hours after meal is 130-160  Please bring your blood sugar monitor to each visit, thank you    Start taking Metformin 500 mg, 1 tablet with your main meal for 5 days.  Occasionally this may initially cause loose stools or nausea. If  tolerating well after 5 days add a second Metformin tablet (500 mg) at the same time.  Continue adding another tablet after 5 days days if no persistent nausea or diarrhea until reaching the 3/ day dose      Counseling time on subjects discussed in assessment and plan sections is over 50% of today's 40 minute visit  Reather LittlerAjay Richrd Kuzniar 09/09/2017, 4:57 PM   Note: This office note was prepared with Dragon voice recognition system technology. Any transcriptional errors that result from this process are unintentional.

## 2017-09-29 IMAGING — DX DG SHOULDER 2+V*R*
3 series · 3 of 3 positions shown · non-contrast
Comparison: None.

CLINICAL DATA: Right shoulder pain, chronic with recent worsening.
No reported injury.

EXAM:
RIGHT SHOULDER - 2+ VIEW

[w shoulder external right]
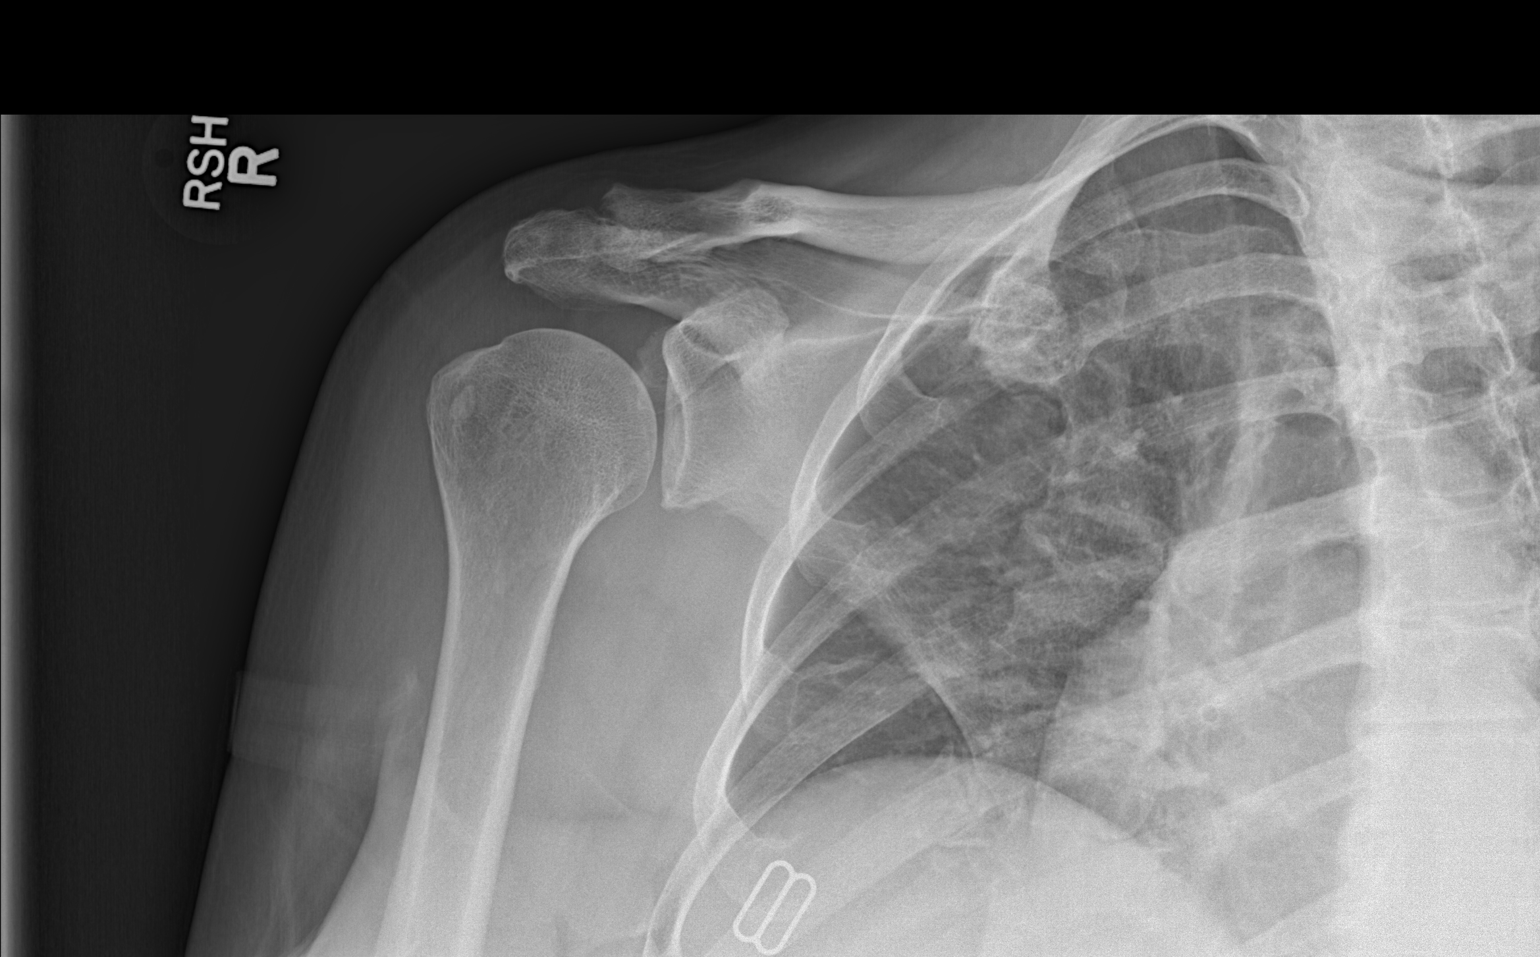

[w shoulder y-view right]
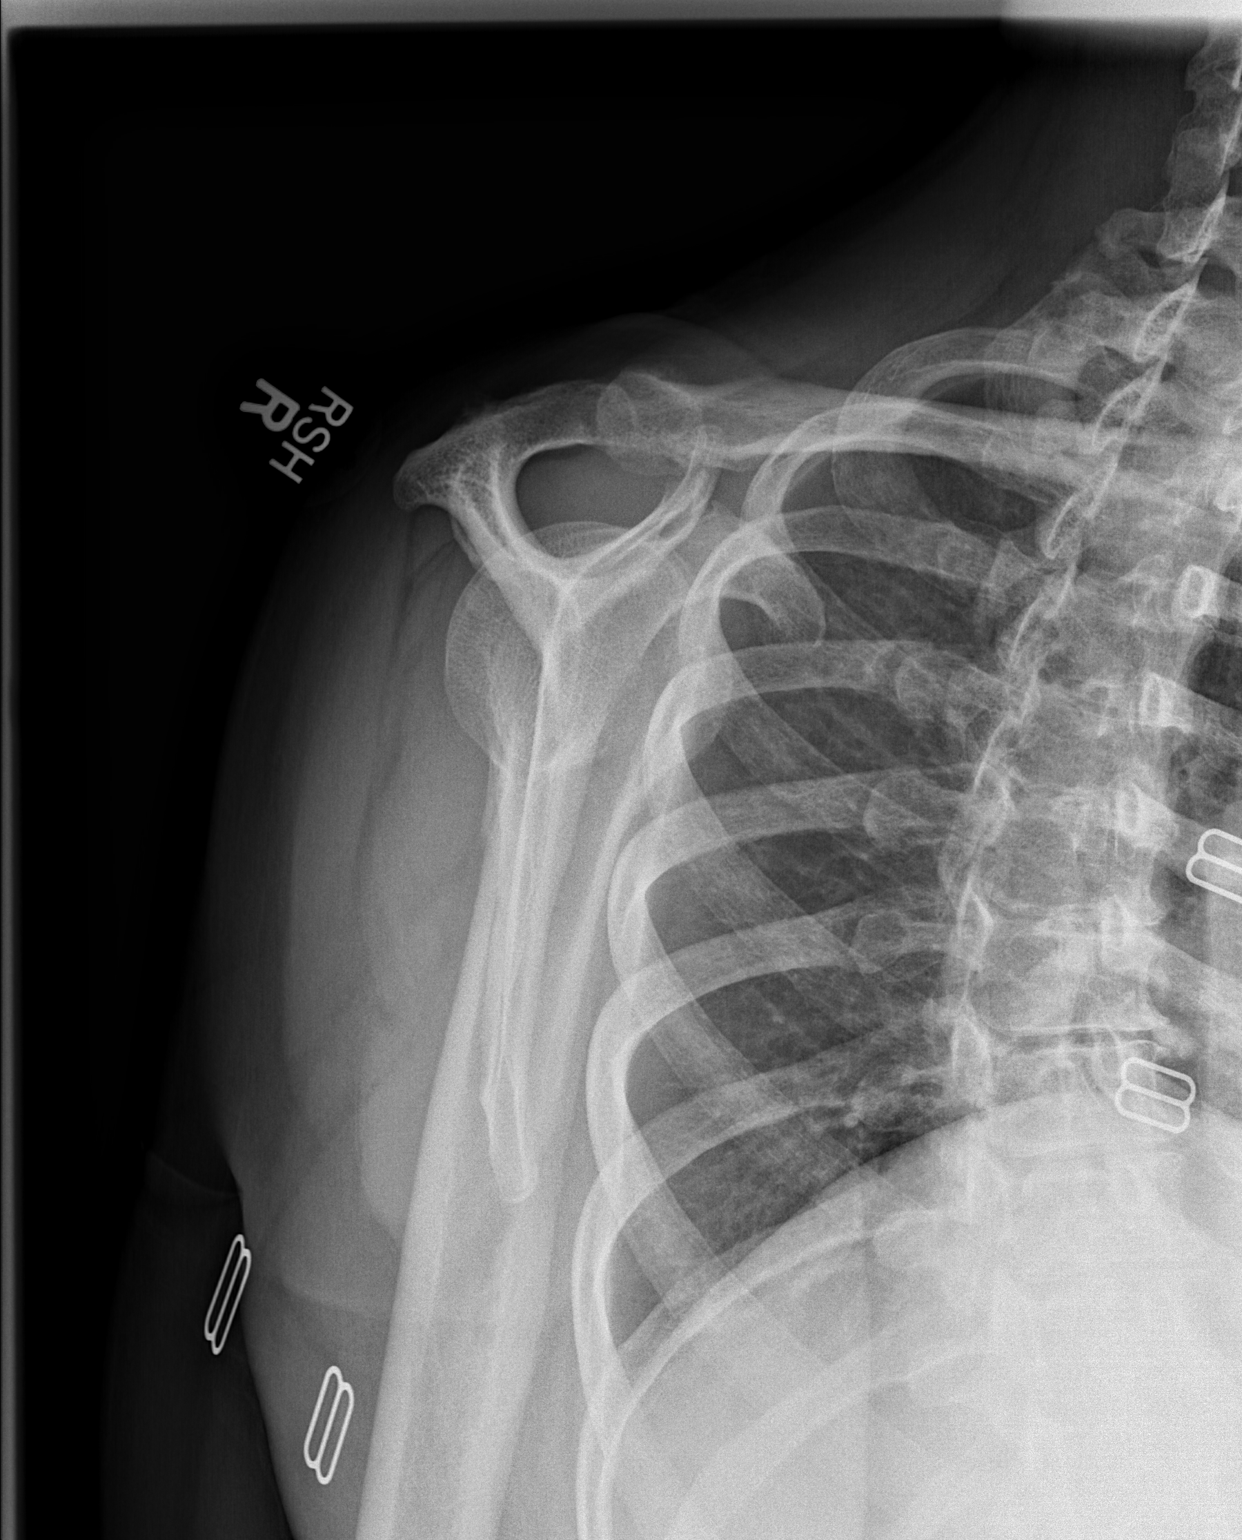

[x shoulder axillary right]
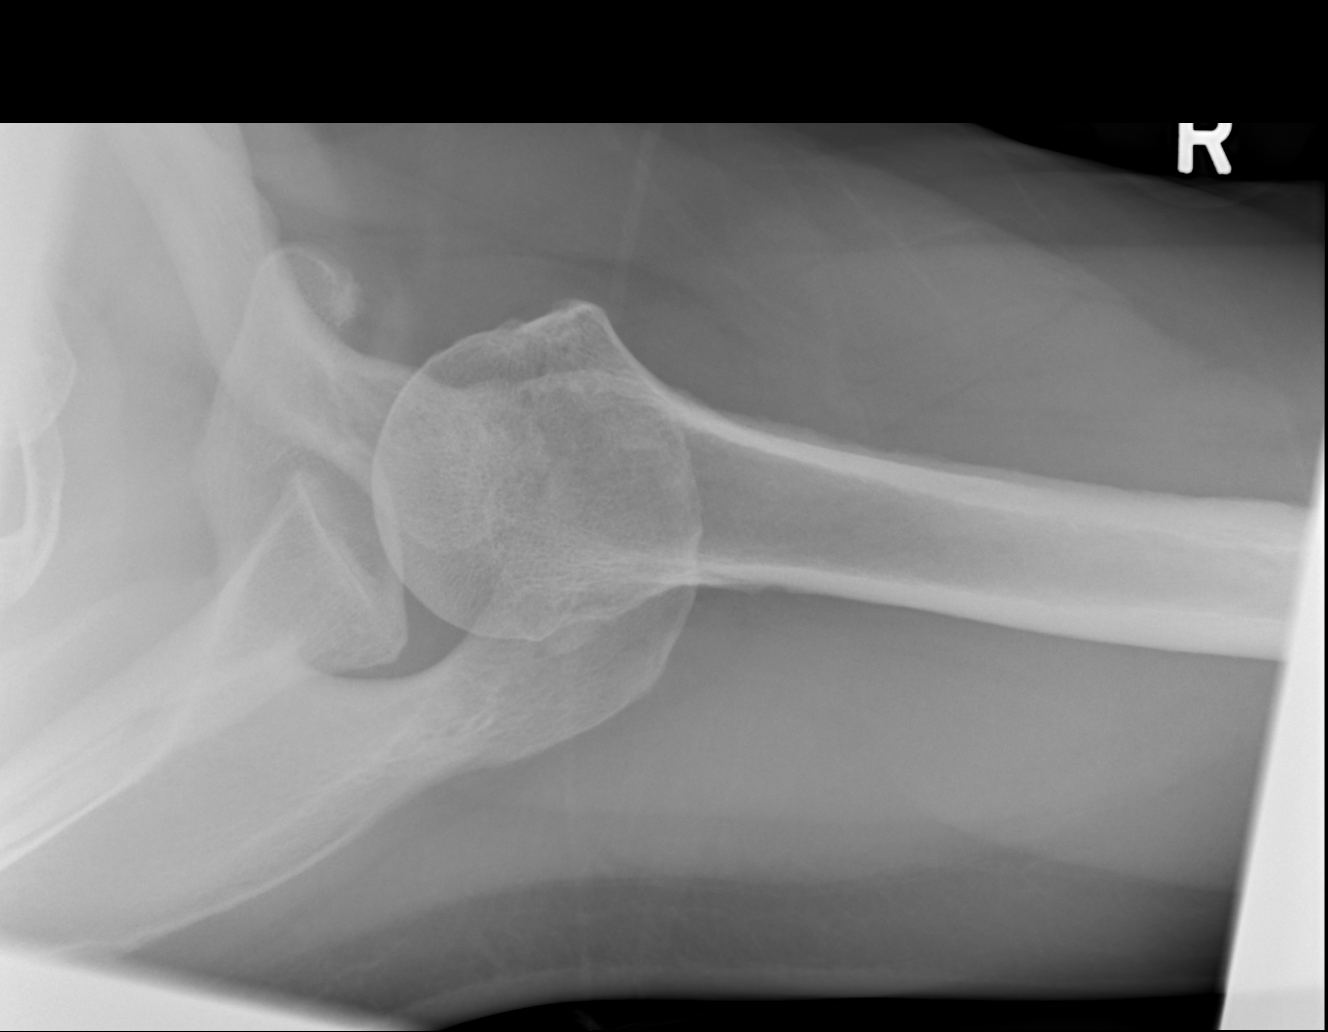

[3 of 3 positions shown; findings below may reference images not displayed]

FINDINGS: No fracture, dislocation or suspicious focal osseous lesion.
Moderate erosive arthropathy is seen at the right acromioclavicular
joint. Right glenohumeral joint appears intact. No pathologic soft
tissue calcifications.
IMPRESSION: Moderate erosive arthropathy at the right acromioclavicular joint,
which could be due to rheumatoid arthritis, primary
hyperparathyroidism or repetitive stress such as from weightlifting.

## 2017-10-08 ENCOUNTER — Encounter: Payer: Self-pay | Admitting: Endocrinology

## 2017-10-08 ENCOUNTER — Other Ambulatory Visit (INDEPENDENT_AMBULATORY_CARE_PROVIDER_SITE_OTHER): Payer: 59

## 2017-10-08 DIAGNOSIS — E042 Nontoxic multinodular goiter: Secondary | ICD-10-CM | POA: Diagnosis not present

## 2017-10-08 DIAGNOSIS — Z794 Long term (current) use of insulin: Secondary | ICD-10-CM

## 2017-10-08 DIAGNOSIS — E1165 Type 2 diabetes mellitus with hyperglycemia: Secondary | ICD-10-CM | POA: Diagnosis not present

## 2017-10-08 LAB — TSH: TSH: 0.5 u[IU]/mL (ref 0.35–4.50)

## 2017-10-08 LAB — BASIC METABOLIC PANEL
BUN: 20 mg/dL (ref 6–23)
CALCIUM: 9.3 mg/dL (ref 8.4–10.5)
CHLORIDE: 100 meq/L (ref 96–112)
CO2: 33 meq/L — AB (ref 19–32)
Creatinine, Ser: 0.69 mg/dL (ref 0.40–1.20)
GFR: 113.53 mL/min (ref >60.00)
GLUCOSE: 196 mg/dL — AB (ref 70–99)
Potassium: 3.6 mEq/L (ref 3.5–5.1)
SODIUM: 140 meq/L (ref 135–145)

## 2017-10-08 LAB — MICROALBUMIN / CREATININE URINE RATIO
Creatinine,U: 145.7 mg/dL
Microalb Creat Ratio: 0.5 mg/g (ref 0.0–30.0)

## 2017-10-09 LAB — FRUCTOSAMINE: FRUCTOSAMINE: 336 umol/L — AB (ref 0–285)

## 2017-10-14 ENCOUNTER — Ambulatory Visit (INDEPENDENT_AMBULATORY_CARE_PROVIDER_SITE_OTHER): Payer: 59 | Admitting: Endocrinology

## 2017-10-14 ENCOUNTER — Encounter: Payer: Self-pay | Admitting: Endocrinology

## 2017-10-14 ENCOUNTER — Encounter: Payer: Self-pay | Admitting: Dietician

## 2017-10-14 ENCOUNTER — Ambulatory Visit: Payer: 59 | Admitting: Endocrinology

## 2017-10-14 ENCOUNTER — Encounter: Payer: 59 | Attending: Endocrinology | Admitting: Dietician

## 2017-10-14 VITALS — BP 140/82 | HR 98 | Resp 16 | Ht 61.0 in | Wt 151.0 lb

## 2017-10-14 DIAGNOSIS — Z794 Long term (current) use of insulin: Secondary | ICD-10-CM | POA: Diagnosis not present

## 2017-10-14 DIAGNOSIS — E1165 Type 2 diabetes mellitus with hyperglycemia: Secondary | ICD-10-CM

## 2017-10-14 DIAGNOSIS — Z713 Dietary counseling and surveillance: Secondary | ICD-10-CM | POA: Insufficient documentation

## 2017-10-14 DIAGNOSIS — E119 Type 2 diabetes mellitus without complications: Secondary | ICD-10-CM

## 2017-10-14 DIAGNOSIS — E7800 Pure hypercholesterolemia, unspecified: Secondary | ICD-10-CM

## 2017-10-14 NOTE — Progress Notes (Signed)
Patient ID: Brenda StandsDeedra Nixon, female   DOB: 29-Jul-1960, 57 y.o.   MRN: [id number]          Reason for Appointment: Follow-up for Type 2 Diabetes    History of Present Illness:          Date of diagnosis of type 2 diabetes mellitus: 1994        Background history:   She has been [place] for her diabetes out of state most of the time and by her PCP in the last year or so She had been on various oral medications in the past, mostly metformin Also had some point had tried Trulicity with some success She thinks she started insulin in 2008 Has been on various insulin regimens including Lantus, Tresiba and NPH and possibly NovoLog Although previous records are not available she thinks her A1c has been close to 7 at some point  Recent history:   INSULIN regimen is:  NPH 40 units daily, Tresiba 40 qd        Non-insulin hypoglycemic drugs the patient is taking are: Metformin 1 g twice a day   Her A1c is 9.6 as of 1/19 Fructosamine is now 336  Current management, blood sugar patterns and problems identified:  She had been advised to start Ozempic and Tresiba on her last visit but she could not afford these  Taking TRESIBA from her PCP using a sample only and she did not let us know she had started this  However she says she will not take this if her blood sugar is near normal in the evening  She is still continuing the NPH she was taking in the morning at the same dose also  She was also advised to start metformin back which had been stopped before because of possible diarrhea, she was told to increase this gradually and she is able to take 1 tablet at breakfast and 1 at lunch  No side effects from this currently  However her blood sugars are significantly high in the evenings, taking mostly before supper  FASTING readings are overall mildly increased but variable and as low as 106  She is not able to afford all her medications and has not checked her blood sugar in a week  However  blood sugars in the evenings are labeled after eating although probably most of them are before supper  She says she will eat sweets regularly because this helps her feel more energetic and not sleeping  She is supposed to see her dietitian today        Side effects from medications have been: None recently   Glucose monitoring:  done 2 times a day         Glucometer:  contour meter  Meter downloaded, readings by download  Mean values apply above for all meters except median for One Touch  PRE-MEAL Fasting Lunch Dinner Bedtime Overall  Glucose range:  108-235   111-185    Mean/median:        POST-MEAL PC Breakfast PC Lunch PC Dinner  Glucose range:    146-372  Mean/median:    227     Self-care: The diet that the patient has been following is: tries to limit carbs, fried food, recently eating sweets.       Typical meal intake: Breakfast is steel cut oats, fruit at 6 am, lunch 11 am, dinner 7 pm,                 Dietician visit, most  recent:none               Exercise: walks most of the day with work routine   Weight history:  Wt Readings from Last 3 Encounters:  10/14/17 151 lb (68.5 kg)  09/09/17 155 lb (70.3 kg)  08/15/17 159 lb (72.1 kg)    Glycemic control:   Lab Results  Component Value Date   HGBA1C 9.6 08/15/2017   Lab Results  Component Value Date   MICROALBUR <0.7 10/08/2017   CREATININE 0.69 10/08/2017   Lab Results  Component Value Date   MICRALBCREAT 0.5 10/08/2017    Lab Results  Component Value Date   FRUCTOSAMINE 336 (H) 10/08/2017      Allergies as of 10/14/2017      Reactions   Sulfa Antibiotics       Medication List        Accurate as of 10/14/17 12:03 PM. Always use your most recent med list.          chlorthalidone 25 MG tablet Commonly known as:  HYGROTON Take 25 mg by mouth daily.   insulin NPH Human 100 UNIT/ML injection Commonly known as:  NOVOLIN N Inject 0.4 mLs (40 Units total) into the skin every morning.     metFORMIN 1000 MG (MOD) 24 hr tablet Commonly known as:  GLUMETZA Take 1,000 mg by mouth 2 (two) times daily with a meal.       Allergies:  Allergies  Allergen Reactions  . Sulfa Antibiotics     Past Medical History:  Diagnosis Date  . Diabetes mellitus without complication (HCC)   . Hypertension     Past Surgical History:  Procedure Laterality Date  . ABDOMINAL HYSTERECTOMY    . ANKLE FRACTURE SURGERY    . CHOLECYSTECTOMY    . THROAT SURGERY      Family History  Problem Relation Age of Onset  . Diabetes Mother   . Diabetes Brother     Social History:  reports that she has never smoked. She has never used smokeless tobacco. She reports that she does not drink alcohol or use drugs.   Review of Systems   THYROID:  Ultrasound reveals multinodular goiter with small nodules not requiring biopsy She thinks she is having more problems with sleepiness and cold intolerance but thyroid levels are normal  Lab Results  Component Value Date   TSH 0.50 10/08/2017     Lipid history: Her last cholesterol was 200 but no LDL available, has not been on any medications   No results found for: CHOL, HDL, LDLCALC, LDLDIRECT, TRIG, CHOLHDL         Hypertension: This has been managed by her PCP with chlorthalidone alone  BP Readings from Last 3 Encounters:  10/14/17 140/82  09/09/17 (!) 148/92  08/15/17 132/78    Most recent eye exam was in 2018, reportedly no retinopathy  Most recent foot exam: 2/19    LABS:  Lab on 10/08/2017  Component Date Value Ref Range Status  . Microalb, Ur 10/08/2017 <0.7  0.0 - 1.9 mg/dL Final  . Creatinine,U 16/04/9603 145.7  mg/dL Final  . Microalb Creat Ratio 10/08/2017 0.5  0.0 - 30.0 mg/g Final  . TSH 10/08/2017 0.50  0.35 - 4.50 uIU/mL Final  . Fructosamine 10/08/2017 336* 0 - 285 umol/L Final   Comment: Published reference interval for apparently healthy subjects between age 26 and 62 is 24 - 285 umol/L and in a  poorly controlled diabetic population is 228 - 563  umol/L with a mean of 396 umol/L.   Marland Kitchen Sodium 10/08/2017 140  135 - 145 mEq/L Final  . Potassium 10/08/2017 3.6  3.5 - 5.1 mEq/L Final  . Chloride 10/08/2017 100  96 - 112 mEq/L Final  . CO2 10/08/2017 33* 19 - 32 mEq/L Final  . Glucose, Bld 10/08/2017 196* 70 - 99 mg/dL Final  . BUN 78/29/5621 20  6 - 23 mg/dL Final  . Creatinine, Ser 10/08/2017 0.69  0.40 - 1.20 mg/dL Final  . Calcium 30/86/5784 9.3  8.4 - 10.5 mg/dL Final  . GFR 69/62/9528 113.53  >60.00 mL/min Final     Physical Examination:  BP 140/82 (BP Location: Left Arm, Patient Position: Sitting, Cuff Size: Normal)   Pulse 98   Resp 16   Ht 5\' 1"  (1.549 m)   Wt 151 lb (68.5 kg)   SpO2 98%   BMI 28.53 kg/m        ASSESSMENT:  Diabetes type 2, uncontrolled with last A1c 9.6  She likely has insulin deficiency with long-standing history of diabetes and also continued use of insulin for almost 10 years  Currently the patient has been on relatively large doses of basal insulin, she is taking NPH in [place] even though she was supposed to take only one or the other along with Ozempic and metformin  Her diet has been poor and blood sugars are mostly high postprandially She is not checking readings after lunch but blood sugars are usually high by suppertime since lunch is her largest meal usually   Today discussed in detail the need for mealtime insulin to cover postprandial spikes, action of mealtime insulin, timing and action of the rapid acting insulin as well as starting dose and dosage titration to target the two-hour reading of under 180   PLAN:    Discussed differences between basal and bolus insulin  She needs to start regular insulin before each meal to help control postprandial hyperglycemia  Since she cannot afford brand name medications she can start with the [employer] brand regular insulin  She can start with 6 units at breakfast and supper and 10 units  at lunchtime  However she will need to take her meter to work and check readings 2-3 hours after meals to help adjust the mealtime injection.  She will see the dietitian today  Continue metformin.  She will bring her monitor for download on each visit  Discussed that if she cannot afford test strips for checking twice a day she can do blood sugar testing once a day by rotation at different times  Discussed blood sugar targets at each time slot  Also has for his basal insulin she will continue the same regimen for now but when she finishes her Evaristo Bury samples she will use 25 NPH at bedtime and 50 in the morning  Will need short-term follow-up in 6 weeks for further adjustment    Patient Instructions  Check blood sugars on waking up  2-3/7  Also check blood sugars about 2 hours after a meal and do this after different meals by rotation  Recommended blood sugar levels on waking up is 90-130 and about 2 hours after meal is 130-180  Please bring your blood sugar monitor to each visit, thank you  Novolin R Relion: 6 units at Bfst and supper and 10 at lunch   When out of Tresiba add 25 N at bedtime and go up to 50 in am  Counseling time on subjects discussed in assessment and plan sections is over 50% of today's 25 minute visit  Reather Littler 10/14/2017, 12:03 PM   Note: This office note was prepared with Dragon voice recognition system technology. Any transcriptional errors that result from this process are unintentional.

## 2017-10-14 NOTE — Patient Instructions (Addendum)
Consider 2000 units of vitamin D daily. Choose water and other beverages without sugar. Have a small amount of carbohydrate at each meal.  Aim for at least 30 grams per meal.  Least processed is best. Aim for 7 hours of sleep per night. Bake, boil, grill rather than fry.  Choose lean meats.  Take the skin off the chicken.  Very little added oil. Consider options that your daughter can cook ahead. When eating fast food, choose low fat, lean meats  Egg McMuffin rather than biscuit  Grilled chicken sandwich with side salad or fruit rather than fries  6" sub ([place], vege)  Down size the portion.  Avoid sugar drinks, choose lower fat, small amount of carbohydrate with meals.  Plan:  Aim for 2-3 Carb Choices per meal (30-45 grams) +/- 1 either way  Aim for 0-1 Carbs per snack if hungry  Include protein in moderation with your meals and snacks Consider reading food labels for Total Carbohydrate and Fat Grams of foods Continue to check your blood sugar as recommended.  Continue to stay active.  Take your medication as discussed with Dr. Lucianne MussKumar today.  Take the fast insulin before each meal.

## 2017-10-14 NOTE — Patient Instructions (Signed)
Check blood sugars on waking up  2-3/7  Also check blood sugars about 2 hours after a meal and do this after different meals by rotation  Recommended blood sugar levels on waking up is 90-130 and about 2 hours after meal is 130-180  Please bring your blood sugar monitor to each visit, thank you  Novolin R Relion: 6 units at Bfst and supper and 10 at lunch   When out of Tresiba add 25 N at bedtime and go up to 50 in am

## 2017-10-15 NOTE — Progress Notes (Signed)
Diabetes Self-Management Education  Visit Type: First/Initial  Appt. Start Time: 1630 Appt. End Time: 1740  10/15/2017  [name], identified by name and date of birth, is a 57 y.o. female with a diagnosis of Diabetes: Type 2. Other history includes HTN.  She has been trying to eat fewer sweets and sugary drinks.  Meals are relatively high in fat and fast food daily.  She complains of getting very sleepy when she drives.  She generally gets about 6-7 hours of sleep per night.  She sometimes does not eat carbohydrates but takes her insulin.   Medications include Metform extended release, Novolin R (6 units before breafast and supper, 10 units before lunch; Tresiba 40 units each day.  When she runs out, she is to change to Novolin N 25 units at HS and 50 units each am.  Patient's daughter and grandchildren live with her.  She is working outside in [employer] and commutes 1 1/2 hours to work.  Her and her daughter share the shopping and cooking.  ASSESSMENT  Height 5\' 1"  (1.549 m), weight 152 lb (68.9 kg). Body mass index is 28.72 kg/m.  Diabetes Self-Management Education - 10/14/17 1651      Visit Information   Visit Type  First/Initial      Initial Visit   Diabetes Type  Type 2    Are you currently following a meal plan?  No    Are you taking your medications as prescribed?  Yes    Date Diagnosed  1994 insulin since about 2008      Health Coping   How would you rate your overall health?  Fair      Psychosocial Assessment   Patient Belief/Attitude about Diabetes  Afraid    Self-care barriers  None    Self-management support  Doctor's office    Other persons present  Patient    Patient Concerns  Nutrition/Meal planning;Glycemic Control;Weight Control    Special Needs  None    Preferred Learning Style  No preference indicated    Learning Readiness  Ready    How often do you need to have someone help you when you read instructions, pamphlets, or other written materials  from your doctor or pharmacy?  1 - Never    What is the last grade level you completed in school?  1 1/2 years college      Pre-Education Assessment   Patient understands the diabetes disease and treatment process.  Needs Review    Patient understands incorporating nutritional management into lifestyle.  Needs Review    Patient undertands incorporating physical activity into lifestyle.  Needs Review    Patient understands using medications safely.  Needs Review    Patient understands monitoring blood glucose, interpreting and using results  Needs Review    Patient understands prevention, detection, and treatment of acute complications.  Needs Review    Patient understands prevention, detection, and treatment of chronic complications.  Needs Review    Patient understands how to develop strategies to address psychosocial issues.  Needs Review    Patient understands how to develop strategies to promote health/change behavior.  Needs Review      Complications   Last HgB A1C per patient/outside source  9.6 % 08/15/17    How often do you check your blood sugar?  1-2 times/day    Fasting Blood glucose range (mg/dL)  >409>200 8:112:45 am    Postprandial Blood glucose range (mg/dL)  >914>200 [id number] 21-3030-60 minutes after a meal (prior  to bed at 7:30)    Number of hypoglycemic episodes per month  0    Number of hyperglycemic episodes per week  14    Can you tell when your blood sugar is high?  Yes    What do you do if your blood sugar is high?  nothing    Have you had a dilated eye exam in the past 12 months?  Yes    Have you had a dental exam in the past 12 months?  Yes    Are you checking your feet?  Yes    How many days per week are you checking your feet?  7      Dietary Intake   Breakfast  pineapple, cottage cheese, or eggs, tomatoes, occasional bacon OR 2 nutrigrain waffles, butter, small amount syrup OR steel cut oats, brown sugar or honey, cinnamon 6 am    Snack (morning)  none    Lunch  leftovers OR  ham sandwich on honey wheat, orange OR crackers with tuna or chicken salad, fruit 12:15    Snack (afternoon)  occasional trail mix (nuts and fruit) OR NABS PB crackers OR candy    Dinner  baked chicken or pork chop or ham, salad or greens, mixed vegetables, occasional sweet potato    Snack (evening)  none    Beverage(s)  water, occasional regular soda, occasional sweet tea, coffee with sugar free creamer, 1 glass once per week       Exercise   Exercise Type  Light (walking / raking leaves)    How many days per week to you exercise?  6    How many minutes per day do you exercise?  600    Total minutes per week of exercise  3600      Patient Education   Previous Diabetes Education  Yes (please comment) 1994    Disease state   Other (comment) review    Nutrition management   Role of diet in the treatment of diabetes and the relationship between the three main macronutrients and blood glucose level;Carbohydrate counting;Food label reading, portion sizes and measuring food.;Information on hints to eating out and maintain blood glucose control.;Meal options for control of blood glucose level and chronic complications.    Physical activity and exercise   Role of exercise on diabetes management, blood pressure control and cardiac health.    Medications  Reviewed medication adjustment guidelines for hyperglycemia and sick days.    Monitoring  Purpose and frequency of SMBG.;Identified appropriate SMBG and/or A1C goals.    Acute complications  Taught treatment of hypoglycemia - the 15 rule.    Psychosocial adjustment  Worked with patient to identify barriers to care and solutions      Individualized Goals (developed by patient)   Nutrition  Follow meal plan discussed    Physical Activity  Exercise 5-7 days per week;60 minutes per day    Medications  take my medication as prescribed    Monitoring   test my blood glucose as discussed    Problem Solving  balanced meal choices    Reducing Risk  examine  blood glucose patterns;do foot checks daily;increase portions of nuts and seeds    Health Coping  discuss diabetes with (comment) MD, RD, CDE      Post-Education Assessment   Patient understands the diabetes disease and treatment process.  Demonstrates understanding / competency    Patient understands incorporating nutritional management into lifestyle.  Demonstrates understanding / competency    Patient  undertands incorporating physical activity into lifestyle.  Demonstrates understanding / competency    Patient understands using medications safely.  Demonstrates understanding / competency    Patient understands monitoring blood glucose, interpreting and using results  Demonstrates understanding / competency    Patient understands prevention, detection, and treatment of acute complications.  Demonstrates understanding / competency    Patient understands prevention, detection, and treatment of chronic complications.  Demonstrates understanding / competency    Patient understands how to develop strategies to address psychosocial issues.  Demonstrates understanding / competency    Patient understands how to develop strategies to promote health/change behavior.  Demonstrates understanding / competency      Outcomes   Expected Outcomes  Demonstrated interest in learning. Expect positive outcomes    Future DMSE  PRN    Program Status  Completed       Individualized Plan for Diabetes Self-Management Training:   Learning Objective:  Patient will have a greater understanding of diabetes self-management. Patient education plan is to attend individual and/or group sessions per assessed needs and concerns.   Plan:   Patient Instructions  Consider 2000 units of vitamin D daily. Choose water and other beverages without sugar. Have a small amount of carbohydrate at each meal.  Aim for at least 30 grams per meal.  Least processed is best. Aim for 7 hours of sleep per night. Bake, boil, grill rather  than fry.  Choose lean meats.  Take the skin off the chicken.  Very little added oil. Consider options that your daughter can cook ahead. When eating fast food, choose low fat, lean meats  Egg McMuffin rather than biscuit  Grilled chicken sandwich with side salad or fruit rather than fries  6" sub ([place], vege)  Down size the portion.  Avoid sugar drinks, choose lower fat, small amount of carbohydrate with meals.  Plan:  Aim for 2-3 Carb Choices per meal (30-45 grams) +/- 1 either way  Aim for 0-1 Carbs per snack if hungry  Include protein in moderation with your meals and snacks Consider reading food labels for Total Carbohydrate and Fat Grams of foods Continue to check your blood sugar as recommended.  Continue to stay active.  Take your medication as discussed with Dr. Lucianne Muss today.  Take the fast insulin before each meal.   Expected Outcomes:  Demonstrated interest in learning. Expect positive outcomes  Education material provided: Food label handouts, A1C conversion sheet, Meal plan card, My Plate and Snack sheet  If problems or questions, patient to contact team via:  Phone  Future DSME appointment: PRN

## 2017-10-18 ENCOUNTER — Ambulatory Visit: Payer: Self-pay | Admitting: Endocrinology

## 2017-10-22 ENCOUNTER — Telehealth: Payer: Self-pay | Admitting: Endocrinology

## 2017-10-22 NOTE — Telephone Encounter (Signed)
The doses will need to be adjusted.  Please take down her actual insulin doses and blood sugar readings for 3 days.  Not aware of any endocrinologist in [place]

## 2017-10-22 NOTE — Telephone Encounter (Signed)
insulin regular (NOVOLIN R,HUMULIN R) 100 units/mL injection  Insulin Degludec (TRESIBA) 100 UNIT/ML SOLN  Patient stated doctor has her taking both of these insulin, she states that her sugars are still running high even after taking both.  Would also like to see if their is an ENDO in [place] that she can go see closer to her work?     Please advise [phone] Please leave message if patient does not answer.

## 2017-10-22 NOTE — Telephone Encounter (Signed)
Patient has been having high b/s of 300's she is going by how the doctor told her to take it. She is taking metformin in the morning she takes insulin in the afternoon and at night she is taking tresibia 40 units at night.  Called [facility]  Please advise

## 2017-10-22 NOTE — Telephone Encounter (Signed)
Attempted to call patient. No VM set up so I was unable to leave message and instruct to call back.

## 2017-10-22 NOTE — Telephone Encounter (Signed)
Please advise 

## 2017-10-23 NOTE — Telephone Encounter (Signed)
Called patient but was not given option to leave VM.

## 2017-11-06 ENCOUNTER — Ambulatory Visit: Payer: 59 | Admitting: Endocrinology

## 2017-11-26 ENCOUNTER — Ambulatory Visit: Payer: 59 | Admitting: Endocrinology

## 2017-11-26 DIAGNOSIS — Z0289 Encounter for other administrative examinations: Secondary | ICD-10-CM

## 2017-11-29 ENCOUNTER — Encounter: Payer: 59 | Admitting: Podiatry

## 2017-12-05 ENCOUNTER — Ambulatory Visit (INDEPENDENT_AMBULATORY_CARE_PROVIDER_SITE_OTHER): Payer: 59 | Admitting: Podiatry

## 2017-12-05 ENCOUNTER — Other Ambulatory Visit: Payer: Self-pay | Admitting: Podiatry

## 2017-12-05 ENCOUNTER — Ambulatory Visit (INDEPENDENT_AMBULATORY_CARE_PROVIDER_SITE_OTHER): Payer: 59

## 2017-12-05 ENCOUNTER — Encounter: Payer: Self-pay | Admitting: Podiatry

## 2017-12-05 VITALS — BP 144/86 | HR 78 | Resp 16

## 2017-12-05 DIAGNOSIS — E1140 Type 2 diabetes mellitus with diabetic neuropathy, unspecified: Secondary | ICD-10-CM

## 2017-12-05 DIAGNOSIS — E1149 Type 2 diabetes mellitus with other diabetic neurological complication: Secondary | ICD-10-CM

## 2017-12-05 DIAGNOSIS — M79672 Pain in left foot: Secondary | ICD-10-CM

## 2017-12-05 DIAGNOSIS — M79671 Pain in right foot: Secondary | ICD-10-CM | POA: Diagnosis not present

## 2017-12-05 DIAGNOSIS — K219 Gastro-esophageal reflux disease without esophagitis: Secondary | ICD-10-CM | POA: Insufficient documentation

## 2017-12-05 MED ORDER — GABAPENTIN 300 MG PO CAPS: 300.0000 mg | ORAL_CAPSULE | Freq: Three times a day (TID) | ORAL | 3 refills | Status: AC

## 2017-12-05 NOTE — Progress Notes (Signed)
   Subjective:    Patient ID: Brenda Nixon, female    DOB: 08-Mar-1961, 57 y.o.   MRN: [id number]  HPI    Review of Systems  Neurological: Positive for numbness.  All other systems reviewed and are negative.      Objective:   Physical Exam        Assessment & Plan:

## 2017-12-05 NOTE — Progress Notes (Signed)
Subjective:   Patient ID: Brenda Nixon, female   DOB: 57 y.o.   MRN: [id number]   HPI Patient presents stating that the patient has developed some burning in her toes over the last month and she does have diabetes and also walks 7 to 10 miles a day in steel toe shoes over the last 3 months and is noticed symptoms increased since then.  Her last A1c was 9.6.  Patient does not smoke and likes to be active   Review of Systems  All other systems reviewed and are negative.       Objective:  Physical Exam  Constitutional: She appears well-developed and well-nourished.  Cardiovascular: Intact distal pulses.  Pulmonary/Chest: Effort normal.  Musculoskeletal: Normal range of motion.  Neurological: She is alert.  Skin: Skin is warm.  Nursing note and vitals reviewed.   Neurovascular status was found to be intact muscle strength adequate range of motion within normal limits with patient noted to have distal irritation of the toes with mild tingling noted.  Patient has good digital perfusion well oriented x3 with moderate structural deformity of the first metatarsal left with redness and pain around the first metatarsal head     Assessment:  Possibility that she is developing low-grade neuropathy versus possibility of condition secondary to her extensive walking in shoe gear usage with steel toes versus other pathology which may be present     Plan:  H&P x-rays reviewed with patient and at this point I have recommended gabapentin with 1 pill at night and followed by 1 in the morning and midafternoon if tolerated well.  We will see the results of this and decide what else may be beneficial for her and a prescription was written today for this medication 300 mg  X-rays indicate no indications of bone pathology with moderate structural bunion deformity left

## 2018-01-06 ENCOUNTER — Ambulatory Visit: Payer: Self-pay | Admitting: Podiatry

## 2018-03-24 NOTE — Progress Notes (Signed)
This encounter was created in error - please disregard.

## 2018-04-03 DIAGNOSIS — M79676 Pain in unspecified toe(s): Secondary | ICD-10-CM

## 2018-06-03 ENCOUNTER — Other Ambulatory Visit: Payer: Self-pay | Admitting: Family Medicine

## 2018-06-03 DIAGNOSIS — E041 Nontoxic single thyroid nodule: Secondary | ICD-10-CM

## 2018-07-07 ENCOUNTER — Ambulatory Visit
Admission: RE | Admit: 2018-07-07 | Discharge: 2018-07-07 | Disposition: A | Discharge status: Home / Self Care | Payer: 59 | Source: Ambulatory Visit | Attending: Family Medicine | Admitting: Family Medicine

## 2018-07-07 DIAGNOSIS — E041 Nontoxic single thyroid nodule: Secondary | ICD-10-CM

## 2018-07-23 ENCOUNTER — Ambulatory Visit: Payer: 59 | Admitting: Dietician

## 2018-07-28 ENCOUNTER — Ambulatory Visit: Payer: 59 | Admitting: Dietician

## 2018-08-07 ENCOUNTER — Encounter (HOSPITAL_BASED_OUTPATIENT_CLINIC_OR_DEPARTMENT_OTHER): Payer: Self-pay

## 2018-08-07 DIAGNOSIS — G4733 Obstructive sleep apnea (adult) (pediatric): Secondary | ICD-10-CM

## 2018-08-07 DIAGNOSIS — R0683 Snoring: Secondary | ICD-10-CM

## 2018-08-07 DIAGNOSIS — R5383 Other fatigue: Secondary | ICD-10-CM

## 2018-08-11 ENCOUNTER — Ambulatory Visit: Payer: BLUE CROSS/BLUE SHIELD | Admitting: Registered"

## 2018-09-12 ENCOUNTER — Encounter (HOSPITAL_BASED_OUTPATIENT_CLINIC_OR_DEPARTMENT_OTHER): Payer: BLUE CROSS/BLUE SHIELD

## 2018-10-21 IMAGING — US US THYROID
1 series · 12 of 25 positions shown · non-contrast
Comparison: None.

CLINICAL DATA: Palpable abnormality.

EXAM:
THYROID ULTRASOUND
TECHNIQUE: Ultrasound examination of the thyroid gland and adjacent soft
tissues was performed.

[Series 1: us thyroid · 0.04mm/px · 12 of 83 slices shown]
[im 4/83]
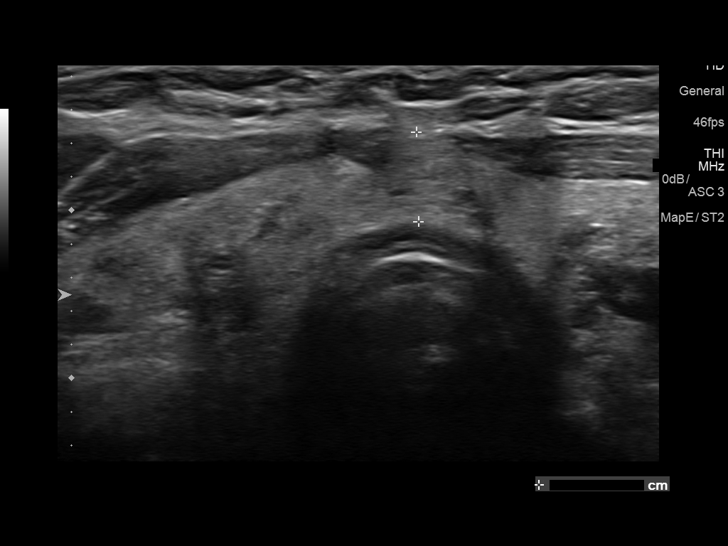
[im 11/83]
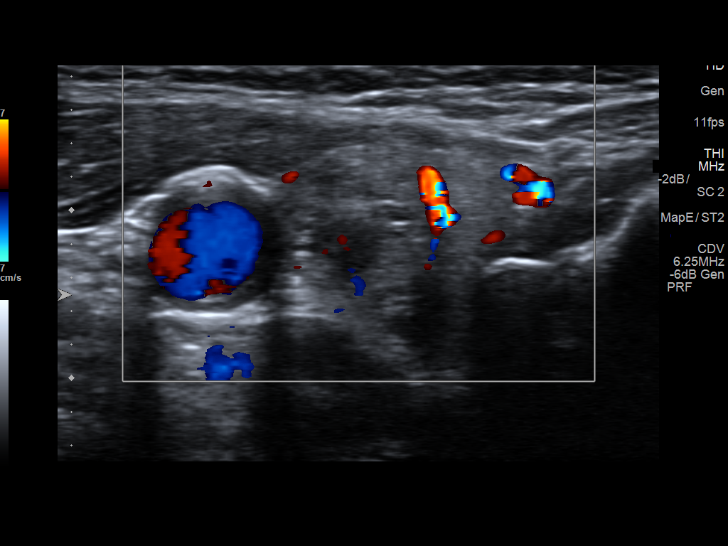
[im 18/83]
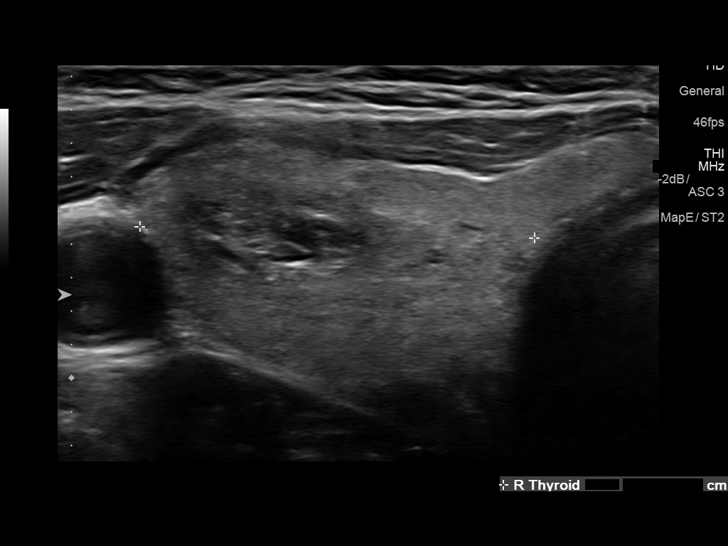
[im 24/83]
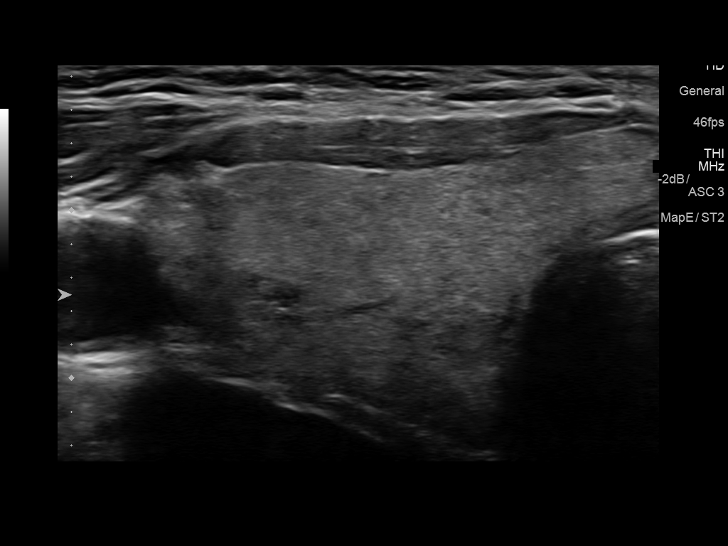
[im 31/83]
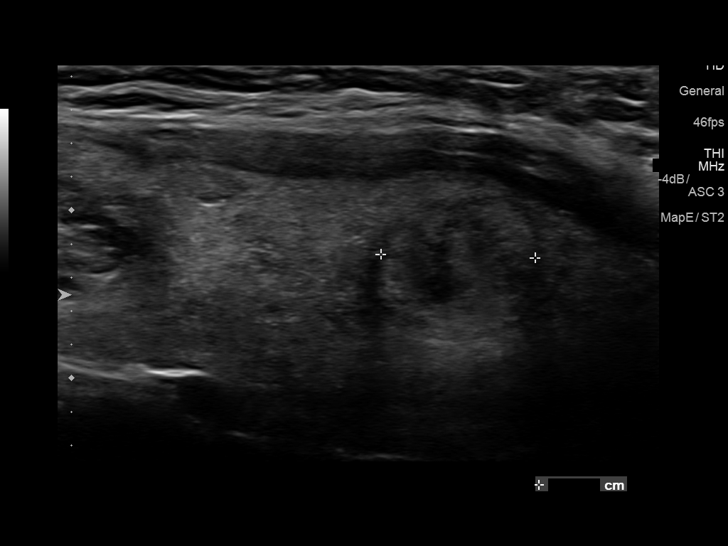
[im 38/83]
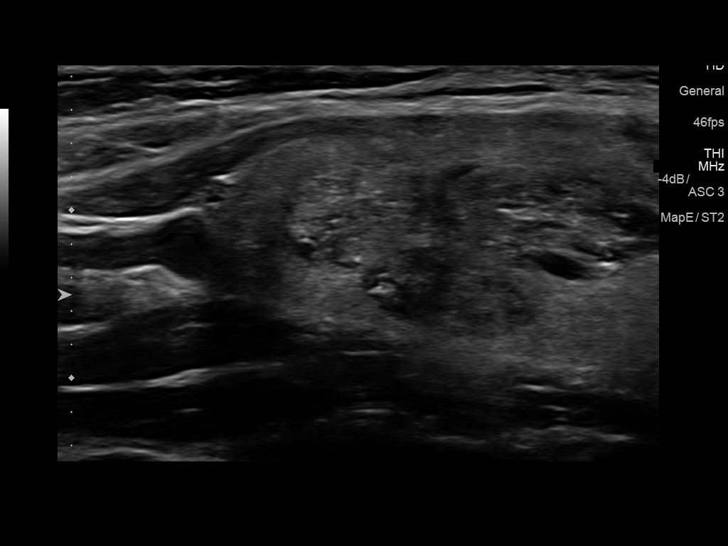
[im 45/83]
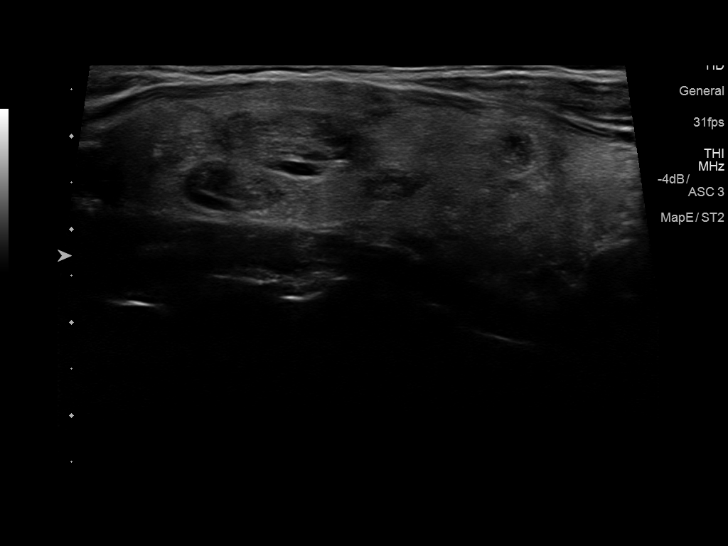
[im 52/83]
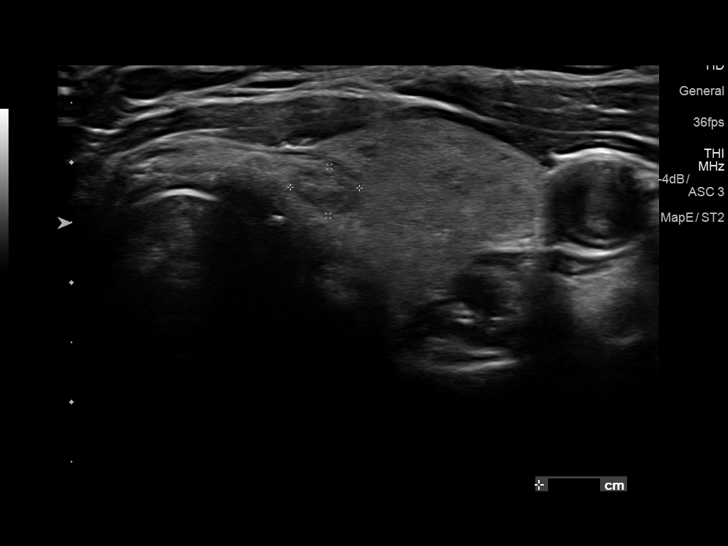
[im 59/83]
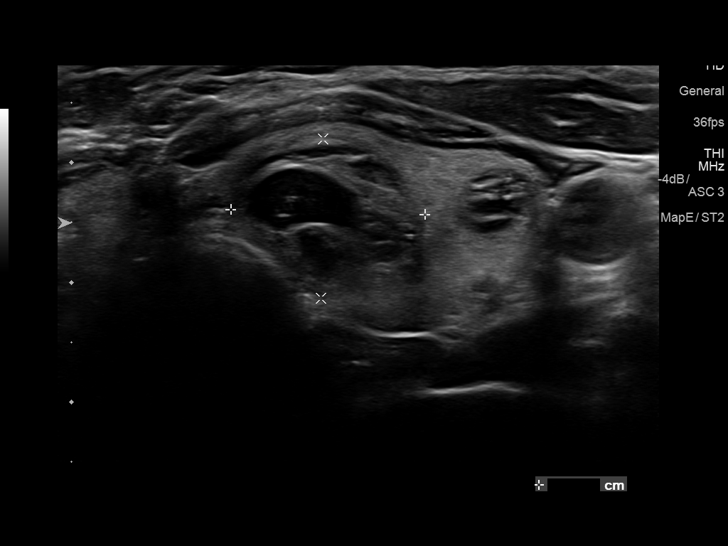
[im 65/83]
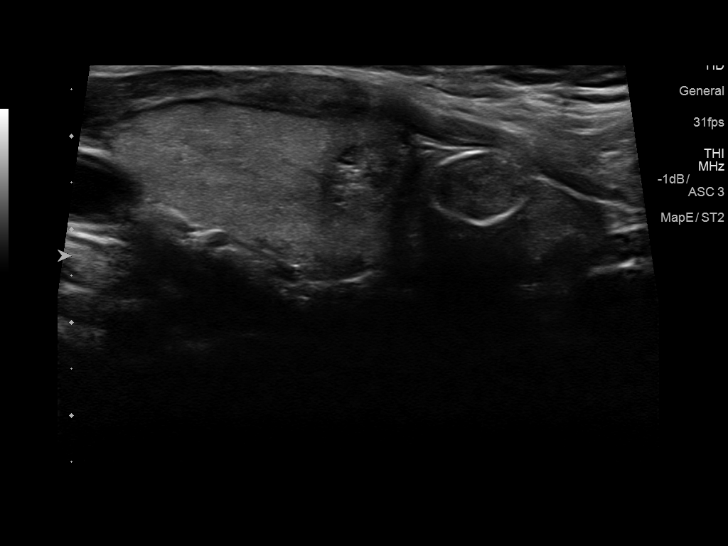
[im 72/83]
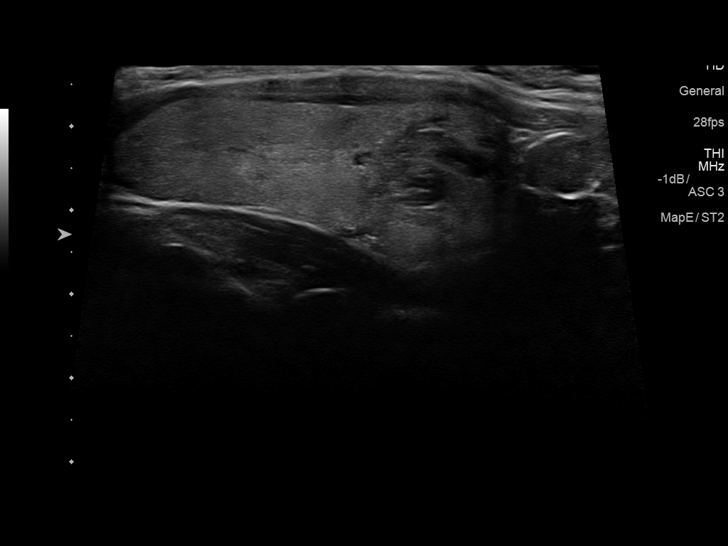
[im 79/83]
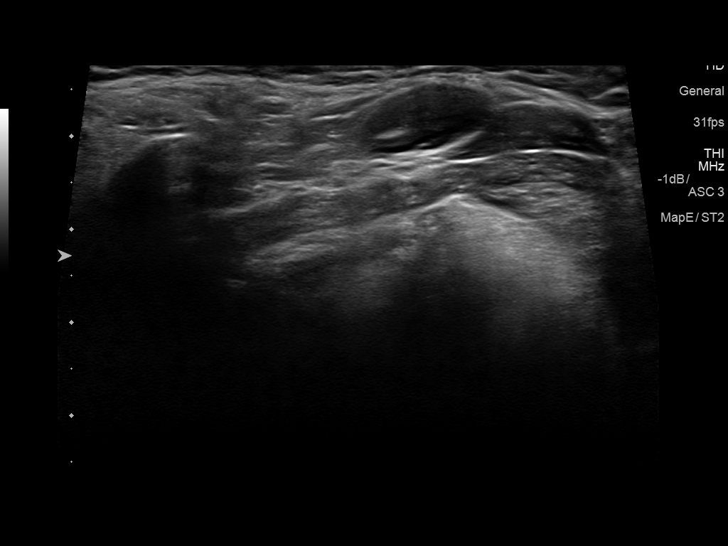

[12 of 25 positions shown; findings below may reference images not displayed]

FINDINGS: Parenchymal Echotexture: Moderately heterogenous

Isthmus: 5 mm

Right lobe: 5.5 x 1.6 x 2.4 cm

Left lobe: 6.0 x 2.0 x 2.6 mm

_________________________________________________________

Estimated total number of nodules >/= 1 cm: 6-10

Number of spongiform nodules >/=  2 cm not described below (TR1): 0

Number of mixed cystic and solid nodules >/= 1.5 cm not described
below (TR2): 2

_________________________________________________________

Nodule # 1:

Location: Right; Superior

Maximum size: 0.9 cm; Other 2 dimensions: 0.9 x 0.9 cm

Composition: solid/almost completely solid (2)

Echogenicity: isoechoic (1)

Shape: not taller-than-wide (0)

Margins: ill-defined (0)

Echogenic foci: none (0)

ACR TI-RADS total points: 3.

ACR TI-RADS risk category: TR3 (3 points).

ACR TI-RADS recommendations:

Given size (<1.4 cm) and appearance, this nodule does NOT meet
TI-RADS criteria for biopsy or dedicated follow-up.

_________________________________________________________

Nodule # 2:

Location: Right; Mid

Maximum size: 1.0 cm; Other 2 dimensions: 0.6 x 0.7 cm

Composition: solid/almost completely solid (2)

Echogenicity: hypoechoic (2)

Shape: not taller-than-wide (0)

Margins: smooth (0)

Echogenic foci: none (0)

ACR TI-RADS total points: 4.

ACR TI-RADS risk category: TR4 (4-6 points).

ACR TI-RADS recommendations:

*Given size (>/= 1 - 1.4 cm) and appearance, a follow-up ultrasound
in 1 year should be considered based on TI-RADS criteria.

_________________________________________________________

Nodule # 4:

Location: Right; Inferior

Maximum size: 1.2 cm; Other 2 dimensions: 0.9 x 0.9 cm

Composition: solid/almost completely solid (2)

Echogenicity: isoechoic (1)

Shape: not taller-than-wide (0)

Margins: ill-defined (0)

Echogenic foci: none (0)

ACR TI-RADS total points: 3.

ACR TI-RADS risk category: TR3 (3 points).

ACR TI-RADS recommendations:

Given size (<1.4 cm) and appearance, this nodule does NOT meet
TI-RADS criteria for biopsy or dedicated follow-up.

_________________________________________________________

Nodule # 9:

Location: Left; Inferior

Maximum size: 1.1 Cm; Other 2 dimensions: 0.8 x 0.9 cm

Composition: solid/almost completely solid (2)

Echogenicity: hypoechoic (2)

Shape: not taller-than-wide (0)

Margins: smooth (0)

Echogenic foci: peripheral calcifications (2)

ACR TI-RADS total points: 6.

ACR TI-RADS risk category: TR4 (4-6 points).

ACR TI-RADS recommendations:

*Given size (>/= 1 - 1.4 cm) and appearance, a follow-up ultrasound
in 1 year should be considered based on TI-RADS criteria.

_________________________________________________________

Additional isoechoic solid nodules noted all measuring 1 cm or less
in size bilaterally which are not fully described by TI RADS
criteria. Dominant 4 nodules are detailed as above.
IMPRESSION: 1.0 cm right midpole TR 4 nodule and the 1.1 cm left inferior TR 4
nodule meet criteria for follow-up in 1 year.

The above is in keeping with the ACR TI-RADS recommendations - [HOSPITAL] 5552;[DATE].

## 2018-10-24 ENCOUNTER — Encounter (HOSPITAL_BASED_OUTPATIENT_CLINIC_OR_DEPARTMENT_OTHER): Payer: BLUE CROSS/BLUE SHIELD

## 2019-11-28 IMAGING — US US THYROID
1 series · 13 of 25 positions shown · non-contrast
Comparison: Prior thyroid ultrasound 05/30/2017

CLINICAL DATA: Prior ultrasound follow-up.

EXAM:
THYROID ULTRASOUND
TECHNIQUE: Ultrasound examination of the thyroid gland and adjacent soft
tissues was performed.

[Series 1: us thyroid · 0.07mm/px · 13 of 63 slices shown]
[im 1/63]
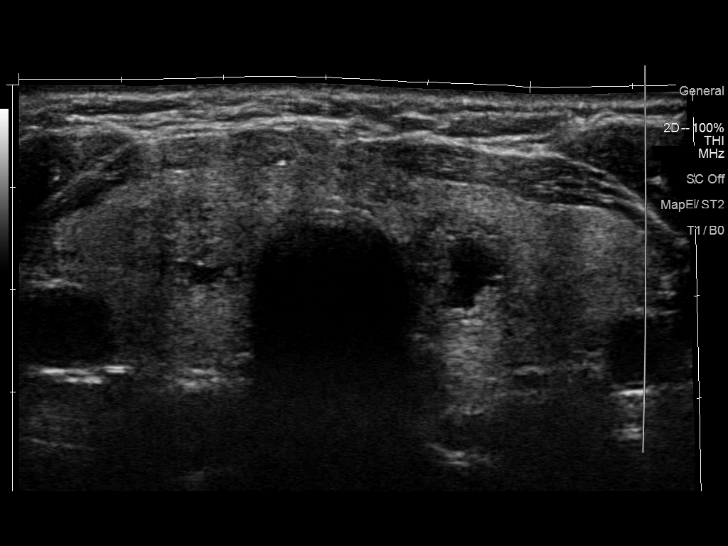
[im 6/63]
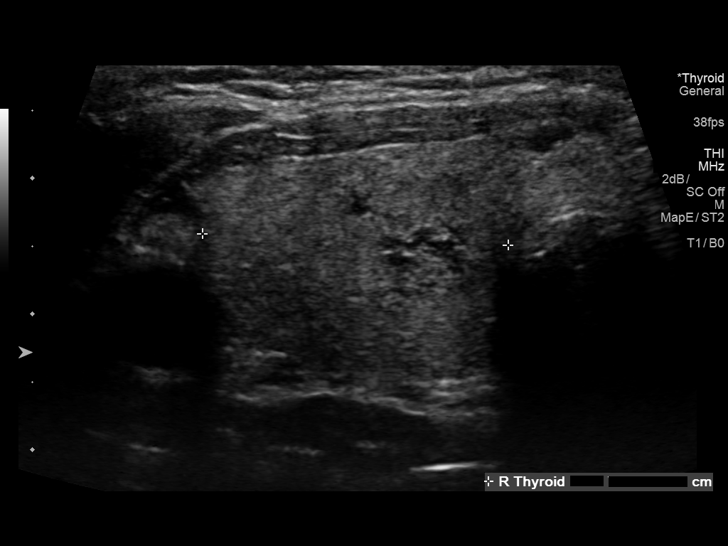
[im 11/63]
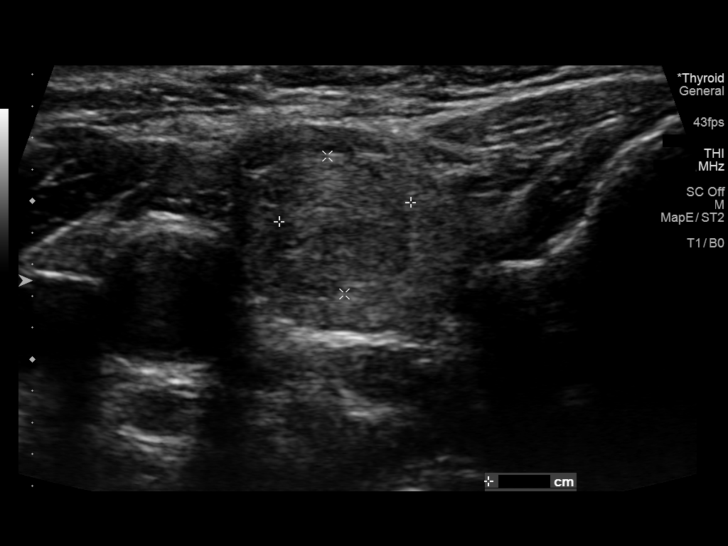
[im 16/63]
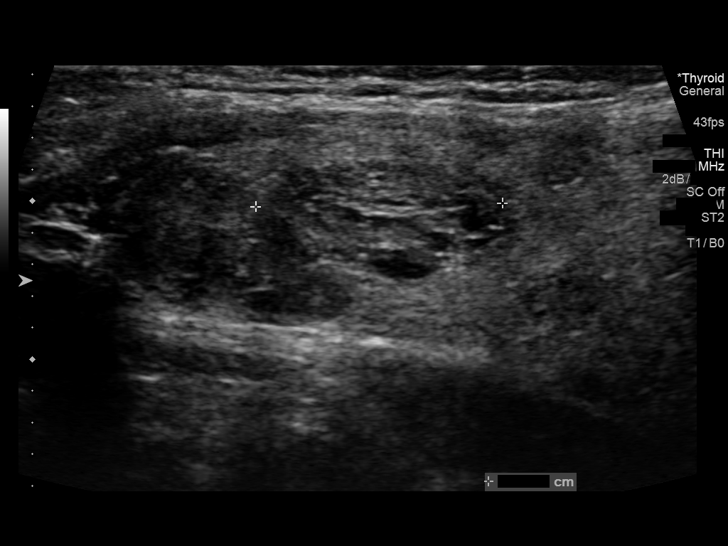
[im 21/63]
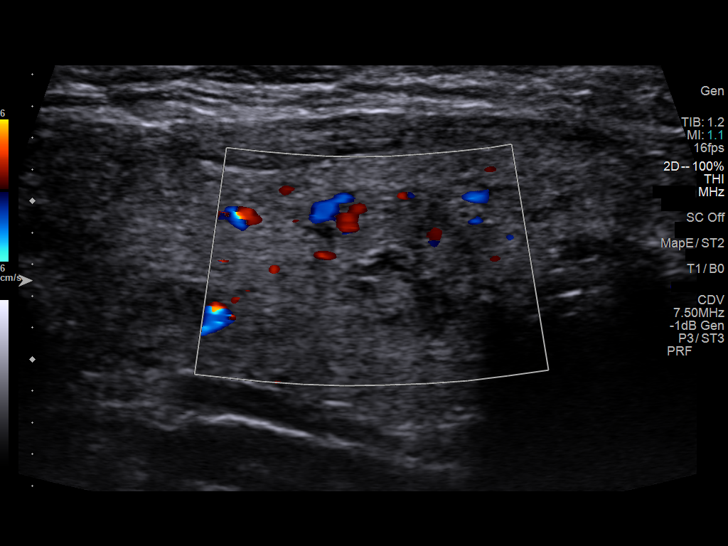
[im 26/63]
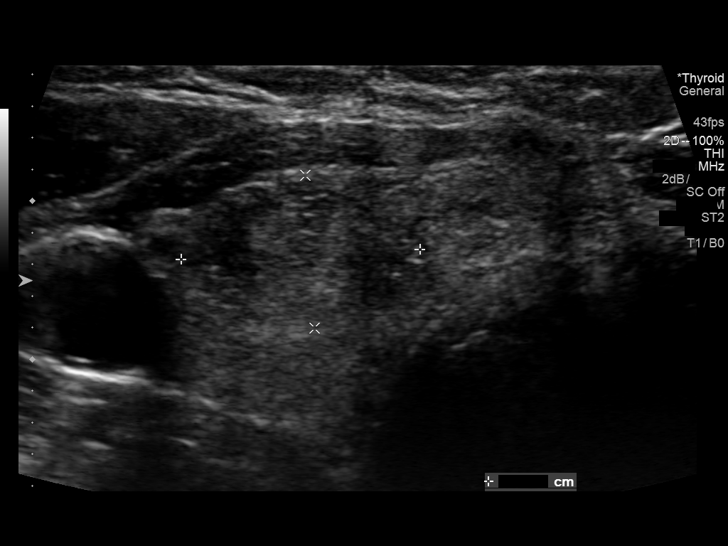
[im 32/63]
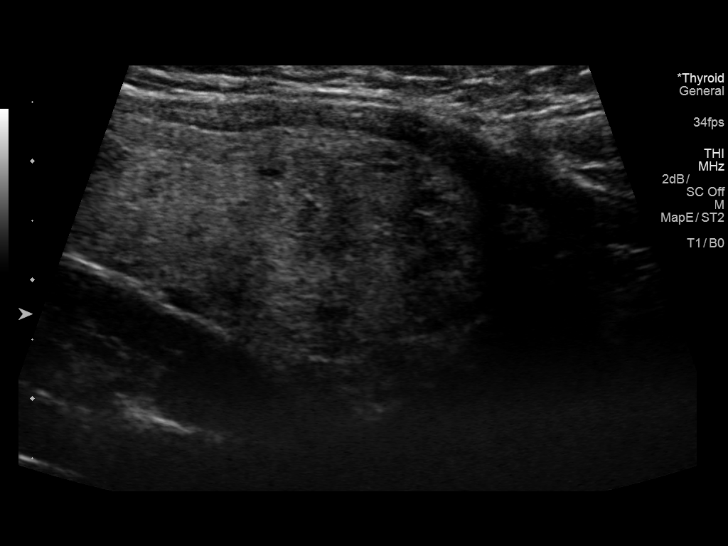
[im 37/63]
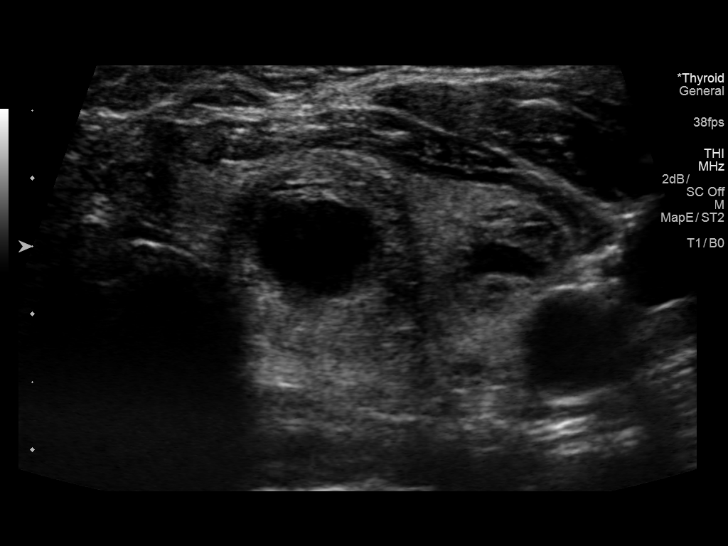
[im 42/63]
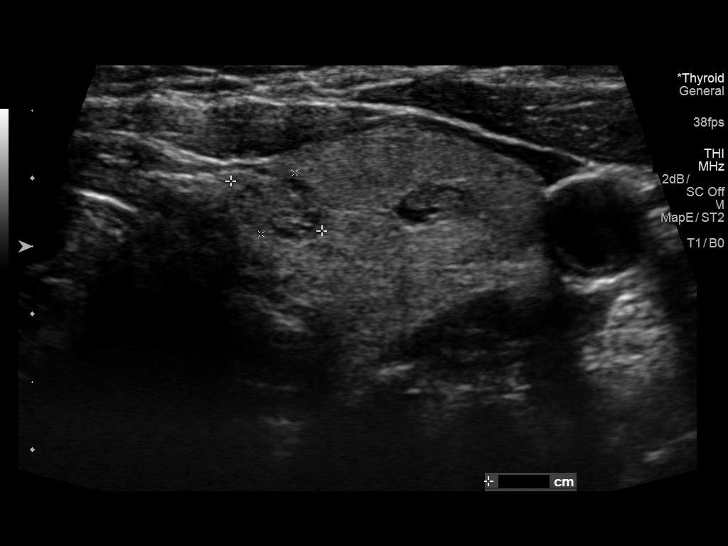
[im 47/63]
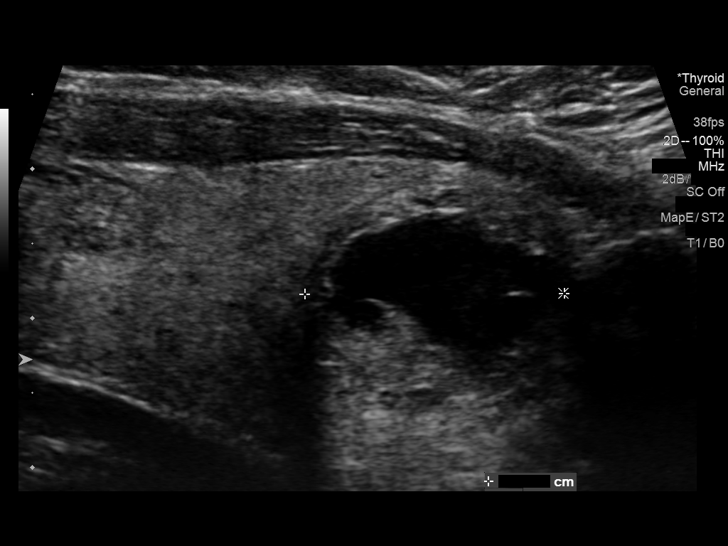
[im 52/63]
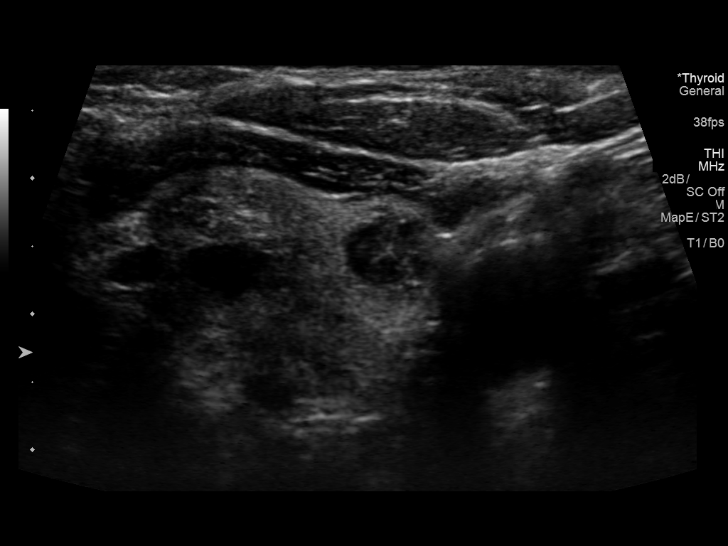
[im 57/63]
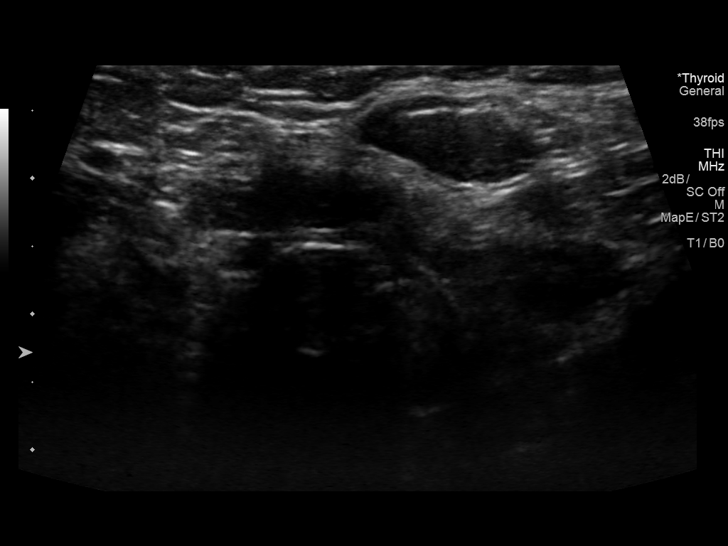
[im 63/63]
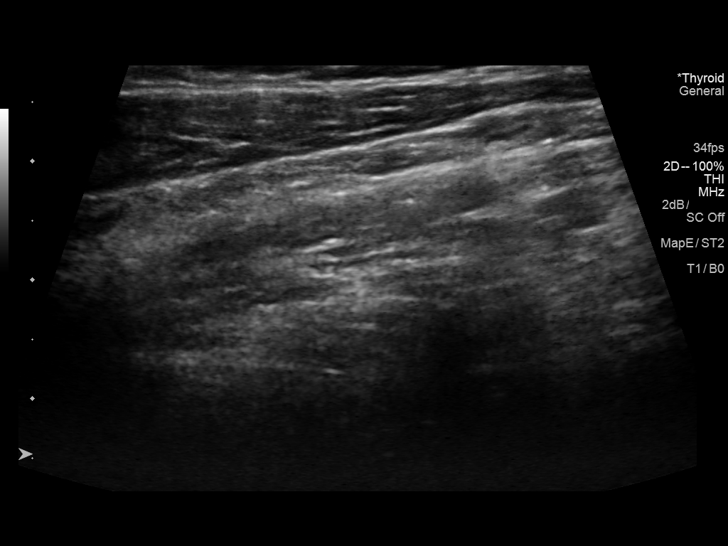

[13 of 25 positions shown; findings below may reference images not displayed]

FINDINGS: Parenchymal Echotexture: Moderately heterogenous

Isthmus: 0.5 cm

Right lobe: 5.6 x 2.0 x 2.3 cm

Left lobe: 5.1 x 2.0 x 2.7 cm

_________________________________________________________

Estimated total number of nodules >/= 1 cm: 6

Number of spongiform nodules >/=  2 cm not described below (TR1): 0

Number of mixed cystic and solid nodules >/= 1.5 cm not described
below (TR2): 0

_________________________________________________________

Nodule # 2: Previously identified 1 cm TI-RADS category 4 nodule in
the right mid to upper gland has regressed in size and now measures
no more than 0.8 cm. This nodule no longer meets criteria for
further evaluation.

_________________________________________________________

Nodule # 8: Previously identified is a 1.1 cm TI-RADS category 4
nodule in the left inferior gland. The nodule is better demonstrated
on today's imaging and is spongiform in appearance consistent with
benignity.

_________________________________________________________

Multiple additional bilateral thyroid nodules are identified. The
majority of the nodules are either benign spongiform, complex cystic
or TI-RADS category 3 and less than 1.5 cm in size. None of the
nodules meet criteria for further imaging follow-up.
IMPRESSION: 1. The previously identified TI-RADS category 4 nodule in the right
mid gland has regressed since the prior study and no longer meets
criteria for further evaluation.
2. The previously identified TI-RADS category 4 nodule in the left
inferior gland is better demonstrated on today's study and has a
spongiform appearance which is considered benign. No further
follow-up required.
3. Multiple additional bilateral thyroid nodules which do which also
do not meet criteria for further imaging follow-up.

The above is in keeping with the ACR TI-RADS recommendations - [HOSPITAL] 4940;[DATE].

## 2021-11-13 DIAGNOSIS — M7711 Lateral epicondylitis, right elbow: Secondary | ICD-10-CM | POA: Diagnosis not present

## 2021-11-13 DIAGNOSIS — M19041 Primary osteoarthritis, right hand: Secondary | ICD-10-CM | POA: Diagnosis not present

## 2021-11-13 DIAGNOSIS — M79641 Pain in right hand: Secondary | ICD-10-CM | POA: Diagnosis not present

## 2021-12-29 DIAGNOSIS — E1169 Type 2 diabetes mellitus with other specified complication: Secondary | ICD-10-CM | POA: Diagnosis not present

## 2021-12-29 DIAGNOSIS — I152 Hypertension secondary to endocrine disorders: Secondary | ICD-10-CM | POA: Diagnosis not present

## 2021-12-29 DIAGNOSIS — E785 Hyperlipidemia, unspecified: Secondary | ICD-10-CM | POA: Diagnosis not present

## 2021-12-29 DIAGNOSIS — E1159 Type 2 diabetes mellitus with other circulatory complications: Secondary | ICD-10-CM | POA: Diagnosis not present

## 2022-06-12 DIAGNOSIS — M7711 Lateral epicondylitis, right elbow: Secondary | ICD-10-CM | POA: Diagnosis not present

## 2022-06-12 DIAGNOSIS — M79642 Pain in left hand: Secondary | ICD-10-CM | POA: Diagnosis not present

## 2022-06-12 DIAGNOSIS — M79641 Pain in right hand: Secondary | ICD-10-CM | POA: Diagnosis not present

## 2022-06-28 DIAGNOSIS — I152 Hypertension secondary to endocrine disorders: Secondary | ICD-10-CM | POA: Diagnosis not present

## 2022-06-28 DIAGNOSIS — E1159 Type 2 diabetes mellitus with other circulatory complications: Secondary | ICD-10-CM | POA: Diagnosis not present

## 2022-06-28 DIAGNOSIS — E785 Hyperlipidemia, unspecified: Secondary | ICD-10-CM | POA: Diagnosis not present

## 2022-06-28 DIAGNOSIS — E1169 Type 2 diabetes mellitus with other specified complication: Secondary | ICD-10-CM | POA: Diagnosis not present

## 2022-06-28 DIAGNOSIS — Z7984 Long term (current) use of oral hypoglycemic drugs: Secondary | ICD-10-CM | POA: Diagnosis not present

## 2022-06-28 DIAGNOSIS — E042 Nontoxic multinodular goiter: Secondary | ICD-10-CM | POA: Diagnosis not present

## 2022-06-29 DIAGNOSIS — I152 Hypertension secondary to endocrine disorders: Secondary | ICD-10-CM | POA: Diagnosis not present

## 2022-06-29 DIAGNOSIS — E1169 Type 2 diabetes mellitus with other specified complication: Secondary | ICD-10-CM | POA: Diagnosis not present

## 2022-06-29 DIAGNOSIS — E1159 Type 2 diabetes mellitus with other circulatory complications: Secondary | ICD-10-CM | POA: Diagnosis not present

## 2022-06-29 DIAGNOSIS — Z0000 Encounter for general adult medical examination without abnormal findings: Secondary | ICD-10-CM | POA: Diagnosis not present

## 2022-06-29 DIAGNOSIS — Z23 Encounter for immunization: Secondary | ICD-10-CM | POA: Diagnosis not present

## 2022-06-29 DIAGNOSIS — E785 Hyperlipidemia, unspecified: Secondary | ICD-10-CM | POA: Diagnosis not present

## 2022-10-10 DIAGNOSIS — I10 Essential (primary) hypertension: Secondary | ICD-10-CM | POA: Diagnosis not present

## 2022-10-10 DIAGNOSIS — Z794 Long term (current) use of insulin: Secondary | ICD-10-CM | POA: Diagnosis not present

## 2022-10-10 DIAGNOSIS — E119 Type 2 diabetes mellitus without complications: Secondary | ICD-10-CM | POA: Diagnosis not present

## 2022-10-10 DIAGNOSIS — Z7985 Long-term (current) use of injectable non-insulin antidiabetic drugs: Secondary | ICD-10-CM | POA: Diagnosis not present

## 2022-10-10 DIAGNOSIS — E1165 Type 2 diabetes mellitus with hyperglycemia: Secondary | ICD-10-CM | POA: Diagnosis not present

## 2022-10-10 DIAGNOSIS — E785 Hyperlipidemia, unspecified: Secondary | ICD-10-CM | POA: Diagnosis not present

## 2023-01-21 DIAGNOSIS — Z7985 Long-term (current) use of injectable non-insulin antidiabetic drugs: Secondary | ICD-10-CM | POA: Diagnosis not present

## 2023-01-21 DIAGNOSIS — E1169 Type 2 diabetes mellitus with other specified complication: Secondary | ICD-10-CM | POA: Diagnosis not present

## 2023-01-21 DIAGNOSIS — Z79899 Other long term (current) drug therapy: Secondary | ICD-10-CM | POA: Diagnosis not present

## 2023-01-21 DIAGNOSIS — Z794 Long term (current) use of insulin: Secondary | ICD-10-CM | POA: Diagnosis not present

## 2023-01-21 DIAGNOSIS — E1159 Type 2 diabetes mellitus with other circulatory complications: Secondary | ICD-10-CM | POA: Diagnosis not present

## 2023-01-21 DIAGNOSIS — E119 Type 2 diabetes mellitus without complications: Secondary | ICD-10-CM | POA: Diagnosis not present

## 2023-01-21 DIAGNOSIS — I10 Essential (primary) hypertension: Secondary | ICD-10-CM | POA: Diagnosis not present

## 2023-01-21 DIAGNOSIS — I152 Hypertension secondary to endocrine disorders: Secondary | ICD-10-CM | POA: Diagnosis not present

## 2023-01-21 DIAGNOSIS — E785 Hyperlipidemia, unspecified: Secondary | ICD-10-CM | POA: Diagnosis not present

## 2023-06-03 ENCOUNTER — Ambulatory Visit (HOSPITAL_COMMUNITY)
Admission: RE | Admit: 2023-06-03 | Discharge: 2023-06-03 | Disposition: A | Discharge status: Home / Self Care | Payer: Self-pay | Source: Ambulatory Visit | Attending: Sports Medicine | Admitting: Sports Medicine

## 2023-06-03 ENCOUNTER — Other Ambulatory Visit (HOSPITAL_COMMUNITY): Payer: Self-pay | Admitting: Sports Medicine

## 2023-06-03 DIAGNOSIS — M79604 Pain in right leg: Secondary | ICD-10-CM

## 2023-06-03 DIAGNOSIS — M7989 Other specified soft tissue disorders: Secondary | ICD-10-CM | POA: Insufficient documentation

## 2023-06-03 NOTE — Progress Notes (Signed)
VASCULAR LAB    Right lower extremity venous duplex has been performed.  See CV proc for preliminary results.  Called report to [facility], [facility], RVT 06/03/2023, 3:50 PM
